# Patient Record
Sex: Female | Born: 1960 | Hispanic: Yes | Smoking: Former smoker
Health system: Southern US, Community
[De-identification: ages and names within clinical notes are randomized; demographics above are authoritative.]

## PROBLEM LIST (undated history)

## (undated) DIAGNOSIS — Z9884 Bariatric surgery status: Secondary | ICD-10-CM

## (undated) DIAGNOSIS — M5137 Other intervertebral disc degeneration, lumbosacral region: Secondary | ICD-10-CM

## (undated) DIAGNOSIS — R7303 Prediabetes: Secondary | ICD-10-CM

## (undated) DIAGNOSIS — E785 Hyperlipidemia, unspecified: Secondary | ICD-10-CM

## (undated) DIAGNOSIS — Z9889 Other specified postprocedural states: Secondary | ICD-10-CM

## (undated) DIAGNOSIS — G35 Multiple sclerosis: Secondary | ICD-10-CM

## (undated) DIAGNOSIS — F419 Anxiety disorder, unspecified: Secondary | ICD-10-CM

## (undated) DIAGNOSIS — M79671 Pain in right foot: Secondary | ICD-10-CM

## (undated) DIAGNOSIS — G709 Myoneural disorder, unspecified: Secondary | ICD-10-CM

## (undated) DIAGNOSIS — E559 Vitamin D deficiency, unspecified: Secondary | ICD-10-CM

## (undated) DIAGNOSIS — D649 Anemia, unspecified: Secondary | ICD-10-CM

## (undated) DIAGNOSIS — R42 Dizziness and giddiness: Secondary | ICD-10-CM

## (undated) DIAGNOSIS — J45909 Unspecified asthma, uncomplicated: Secondary | ICD-10-CM

## (undated) DIAGNOSIS — F32A Depression, unspecified: Secondary | ICD-10-CM

## (undated) HISTORY — PX: CHOLECYSTECTOMY: SHX55

## (undated) HISTORY — PX: TONSILLECTOMY: SUR1361

## (undated) HISTORY — PX: GASTRIC BYPASS: SHX52

## (undated) HISTORY — PX: INCISE AND DRAIN ABCESS: PRO64

## (undated) HISTORY — PX: BREAST SURGERY: SHX581

## (undated) HISTORY — PX: BREAST EXCISIONAL BIOPSY: SUR124

## (undated) HISTORY — PX: ABDOMINAL HYSTERECTOMY: SHX81

## (undated) HISTORY — PX: MYOMECTOMY: SHX85

---

## 2008-07-26 ENCOUNTER — Ambulatory Visit: Payer: Self-pay | Admitting: Family Medicine

## 2008-11-04 ENCOUNTER — Ambulatory Visit: Payer: Self-pay | Admitting: Internal Medicine

## 2009-01-01 ENCOUNTER — Ambulatory Visit: Payer: Self-pay | Admitting: Internal Medicine

## 2009-11-03 ENCOUNTER — Ambulatory Visit: Payer: Self-pay | Admitting: Internal Medicine

## 2010-03-11 ENCOUNTER — Ambulatory Visit: Payer: Self-pay | Admitting: Obstetrics and Gynecology

## 2010-04-16 ENCOUNTER — Ambulatory Visit: Payer: Self-pay | Admitting: Internal Medicine

## 2010-07-20 ENCOUNTER — Ambulatory Visit: Payer: Self-pay | Admitting: Internal Medicine

## 2010-09-13 ENCOUNTER — Ambulatory Visit: Payer: Self-pay | Admitting: Internal Medicine

## 2010-09-23 ENCOUNTER — Ambulatory Visit: Payer: Self-pay | Admitting: Internal Medicine

## 2011-02-12 ENCOUNTER — Ambulatory Visit: Payer: Self-pay | Admitting: Family Medicine

## 2012-02-07 ENCOUNTER — Ambulatory Visit: Payer: Self-pay | Admitting: Medical

## 2012-02-07 LAB — RAPID STREP-A WITH REFLX: Micro Text Report: NEGATIVE

## 2012-02-10 LAB — BETA STREP CULTURE(ARMC)

## 2012-12-31 ENCOUNTER — Ambulatory Visit: Payer: Self-pay | Admitting: Family Medicine

## 2013-08-05 ENCOUNTER — Ambulatory Visit: Payer: Self-pay | Admitting: Physician Assistant

## 2013-08-05 LAB — CBC WITH DIFFERENTIAL/PLATELET
Basophil #: 0 10*3/uL (ref 0.0–0.1)
Basophil %: 0.3 %
Eosinophil #: 0 10*3/uL (ref 0.0–0.7)
Eosinophil %: 0 %
HCT: 43.8 % (ref 35.0–47.0)
HGB: 14.1 g/dL (ref 12.0–16.0)
LYMPHS PCT: 5.1 %
Lymphocyte #: 0.7 10*3/uL — ABNORMAL LOW (ref 1.0–3.6)
MCH: 28.3 pg (ref 26.0–34.0)
MCHC: 32.2 g/dL (ref 32.0–36.0)
MCV: 88 fL (ref 80–100)
MONOS PCT: 3.1 %
Monocyte #: 0.4 x10 3/mm (ref 0.2–0.9)
NEUTROS ABS: 13.3 10*3/uL — AB (ref 1.4–6.5)
Neutrophil %: 91.5 %
Platelet: 179 10*3/uL (ref 150–440)
RBC: 4.99 10*6/uL (ref 3.80–5.20)
RDW: 14.8 % — ABNORMAL HIGH (ref 11.5–14.5)
WBC: 14.5 10*3/uL — AB (ref 3.6–11.0)

## 2013-08-05 LAB — BASIC METABOLIC PANEL
Anion Gap: 10 (ref 7–16)
BUN: 19 mg/dL — AB (ref 7–18)
CALCIUM: 8.9 mg/dL (ref 8.5–10.1)
CHLORIDE: 102 mmol/L (ref 98–107)
CO2: 25 mmol/L (ref 21–32)
Creatinine: 0.8 mg/dL (ref 0.60–1.30)
EGFR (Non-African Amer.): >60
Glucose: 89 mg/dL (ref 65–99)
Osmolality: 276 (ref 275–301)
POTASSIUM: 3.7 mmol/L (ref 3.5–5.1)
Sodium: 137 mmol/L (ref 136–145)

## 2014-07-12 ENCOUNTER — Ambulatory Visit: Payer: Self-pay

## 2014-07-25 ENCOUNTER — Ambulatory Visit: Payer: Self-pay

## 2014-10-28 ENCOUNTER — Ambulatory Visit: Payer: Self-pay | Admitting: Family Medicine

## 2015-11-05 ENCOUNTER — Ambulatory Visit: Payer: Self-pay | Attending: Oncology

## 2015-11-28 ENCOUNTER — Ambulatory Visit
Admission: RE | Admit: 2015-11-28 | Discharge: 2015-11-28 | Disposition: A | Discharge status: Home / Self Care | Payer: Self-pay | Source: Ambulatory Visit | Attending: Oncology | Admitting: Oncology

## 2015-11-28 ENCOUNTER — Ambulatory Visit: Payer: Self-pay | Attending: Oncology

## 2015-11-28 VITALS — BP 138/78 | HR 78 | Temp 98.3°F | Resp 20 | Ht 66.54 in | Wt 190.8 lb

## 2015-11-28 DIAGNOSIS — Z0000 Encounter for general adult medical examination without abnormal findings: Secondary | ICD-10-CM

## 2015-11-28 NOTE — Progress Notes (Addendum)
Subjective:     Patient ID: Nancy Duran, female   DOB: 10/04/1960, 55 y.o.   MRN: [id number]  HPI   Review of Systems     Objective:   Physical Exam  Pulmonary/Chest:         Assessment:     55 year old patient presents for [facility] clinic visit. Patient screened, and meets BCCCP eligibility.  Patient does not have insurance, Medicare or Medicaid.  Handout given on Affordable Care Act.  Instructed patient on breast self-exam using teach back method.  CBE unremarkable.  Noted superficial sebaceous cyst upper inner quadrant right breast noted on prior exams.  Patient has MS, and is being followed by physician in [place].  States she will receive Medicare in May to help cover medications.    Plan:     Sent for bilateral screening mammogram.

## 2015-12-03 NOTE — Progress Notes (Signed)
Letter mailed from [facility] to notify of normal mammogram results.  Patient to return in one year for annual screening.  Copy to [facility]. 

## 2015-12-05 ENCOUNTER — Encounter: Payer: Self-pay | Admitting: *Deleted

## 2015-12-05 ENCOUNTER — Ambulatory Visit
Admission: EM | Admit: 2015-12-05 | Discharge: 2015-12-05 | Disposition: A | Discharge status: Home / Self Care | Payer: Self-pay | Attending: Family Medicine | Admitting: Family Medicine

## 2015-12-05 DIAGNOSIS — J069 Acute upper respiratory infection, unspecified: Secondary | ICD-10-CM

## 2015-12-05 DIAGNOSIS — J019 Acute sinusitis, unspecified: Secondary | ICD-10-CM

## 2015-12-05 LAB — RAPID INFLUENZA A&B ANTIGENS: Influenza B (ARMC): NEGATIVE

## 2015-12-05 LAB — RAPID INFLUENZA A&B ANTIGENS (ARMC ONLY): INFLUENZA A (ARMC): NEGATIVE

## 2015-12-05 LAB — RAPID STREP SCREEN (MED CTR MEBANE ONLY): STREPTOCOCCUS, GROUP A SCREEN (DIRECT): NEGATIVE

## 2015-12-05 MED ORDER — FEXOFENADINE-PSEUDOEPHED ER 180-240 MG PO TB24: 1.0000 | ORAL_TABLET | Freq: Every day | ORAL | Status: DC

## 2015-12-05 MED ORDER — FLUTICASONE PROPIONATE 50 MCG/ACT NA SUSP: 2.0000 | Freq: Every day | NASAL | Status: AC

## 2015-12-05 MED ORDER — BENZONATATE 200 MG PO CAPS: 200.0000 mg | ORAL_CAPSULE | Freq: Three times a day (TID) | ORAL | Status: DC | PRN

## 2015-12-05 MED ORDER — AMOXICILLIN-POT CLAVULANATE 875-125 MG PO TABS: 1.0000 | ORAL_TABLET | Freq: Two times a day (BID) | ORAL | Status: DC

## 2015-12-05 NOTE — Discharge Instructions (Signed)
Sinus Rinse WHAT IS A SINUS RINSE? A sinus rinse is a home treatment. It rinses your sinuses with a mixture of salt and water (saline solution). Sinuses are air-filled spaces in your skull behind the bones of your face and forehead. They open into your nasal cavity. To do a sinus rinse, you will need:  Saline solution.  Neti pot or spray bottle. This releases the saline solution into your nose and through your sinuses. You can buy neti pots and spray bottles at:  Your local pharmacy.  A health food store.  Online. WHEN WOULD I DO A SINUS RINSE?  A sinus rinse can help to clear your nasal cavity. It can clear:   Mucus.  Dirt.  Dust.  Pollen. You may do a sinus rinse when you have:  A cold.  A virus.  Allergies.  A sinus infection.  A stuffy nose. If you are considering a sinus rinse:  Ask your child's doctor before doing a sinus rinse on your child.  Do not do a sinus rinse if you have had:  Ear or nasal surgery.  An ear infection.  Blocked ears. HOW DO I DO A SINUS RINSE?   Wash your hands.  Disinfect your device using the directions that came with the device.  Dry your device.  Use the solution that comes with your device or one that is sold separately in stores. Follow the mixing directions on the package.  Fill your device with the amount of saline solution as stated in the device instructions.  Stand over a sink and tilt your head sideways over the sink.  Place the spout of the device in your upper nostril (the one closer to the ceiling).  Gently pour or squeeze the saline solution into the nasal cavity. The liquid should drain to the lower nostril if you are not too congested.  Gently blow your nose. Blowing too hard may cause ear pain.  Repeat in the other nostril.  Clean and rinse your device with clean water.  Air-dry your device. ARE THERE RISKS OF A SINUS RINSE?  Sinus rinse is normally very safe and helpful. However, there are a few  risks, which include:   A burning feeling in the sinuses. This may happen if you do not make the saline solution as instructed. Make sure to follow all directions when making the saline solution.  Infection from unclean water. This is rare, but possible.  Nasal irritation.   This information is not intended to replace advice given to you by your health care provider. Make sure you discuss any questions you have with your health care provider.   Document Released: 02/15/2014 Document Reviewed: 02/15/2014 Elsevier Interactive Patient Education 2016 Elsevier Inc.  Upper Respiratory Infection, Adult Most upper respiratory infections (URIs) are caused by a virus. A URI affects the nose, throat, and upper air passages. The most common type of URI is often called "the common cold." HOME CARE   Take medicines only as told by your doctor.  Gargle warm saltwater or take cough drops to comfort your throat as told by your doctor.  Use a warm mist humidifier or inhale steam from a shower to increase air moisture. This may make it easier to breathe.  Drink enough fluid to keep your pee (urine) clear or pale yellow.  Eat soups and other clear broths.  Have a healthy diet.  Rest as needed.  Go back to work when your fever is gone or your doctor says it is  okay.  You may need to stay home longer to avoid giving your URI to others.  You can also wear a face mask and wash your hands often to prevent spread of the virus.  Use your inhaler more if you have asthma.  Do not use any tobacco products, including cigarettes, chewing tobacco, or electronic cigarettes. If you need help quitting, ask your doctor. GET HELP IF:  You are getting worse, not better.  Your symptoms are not helped by medicine.  You have chills.  You are getting more short of breath.  You have brown or red mucus.  You have yellow or brown discharge from your nose.  You have pain in your face, especially when you  bend forward.  You have a fever.  You have puffy (swollen) neck glands.  You have pain while swallowing.  You have white areas in the back of your throat. GET HELP RIGHT AWAY IF:   You have very bad or constant:  Headache.  Ear pain.  Pain in your forehead, behind your eyes, and over your cheekbones (sinus pain).  Chest pain.  You have long-lasting (chronic) lung disease and any of the following:  Wheezing.  Long-lasting cough.  Coughing up blood.  A change in your usual mucus.  You have a stiff neck.  You have changes in your:  Vision.  Hearing.  Thinking.  Mood. MAKE SURE YOU:   Understand these instructions.  Will watch your condition.  Will get help right away if you are not doing well or get worse.   This information is not intended to replace advice given to you by your health care provider. Make sure you discuss any questions you have with your health care provider.   Document Released: 01/07/2008 Document Revised: 12/05/2014 Document Reviewed: 10/26/2013 Elsevier Interactive Patient Education [employer].

## 2015-12-05 NOTE — ED Provider Notes (Signed)
CSN: [id number]     Arrival date & time 12/05/15  47 History   First MD Initiated Contact with Patient 12/05/15 1224       Nurses notes were reviewed. Chief Complaint  Patient presents with  . Headache  . Sore Throat  . Fever  . Nasal Congestion  Patient reports that 2 weeks she's not felt herself or for right. She's told me that several times during this visit that she doesn't feel right. She reports days congestion cough and pressure behind her eyes. Sore throat as well. She denies coughing up anything productive but also has had a cough and congestion. When asked about fever that her description of her fever is vague temp spikes that last one was about 2 or 3 days ago. She states she's tried several over-the-counter medications and treatments without success.   She is not a smoker at this time no significant family medical history she has had breast biopsy and she has MS. No known drug allergies.   (Consider location/radiation/quality/duration/timing/severity/associated sxs/prior Treatment) Patient is a 55 y.o. female presenting with headaches, pharyngitis, and fever. The history is provided by the patient. No language interpreter was used.  Headache Pain location:  Generalized Radiates to:  Does not radiate Timing:  Constant Associated symptoms: fever   Sore Throat This is a new problem. The current episode started more than 1 week ago. The problem occurs constantly. The problem has not changed since onset.Associated symptoms include headaches. Nothing aggravates the symptoms. Nothing relieves the symptoms. She has tried nothing for the symptoms. The treatment provided no relief.  Fever Temp source:  Subjective Timing:  Intermittent Progression:  Waxing and waning Associated symptoms: headaches     Past Medical History  Diagnosis Date  . Multiple sclerosis [facility])    Past Surgical History  Procedure Laterality Date  . Breast excisional biopsy Left 1990's    NEG   Family  History  Problem Relation Age of Onset  . Breast cancer Sister 6  . Breast cancer Paternal Aunt    Social History  Substance Use Topics  . Smoking status: Never Smoker   . Smokeless tobacco: None  . Alcohol Use: No   OB History    No data available     Review of Systems  Constitutional: Positive for fever.  Neurological: Positive for headaches.  All other systems reviewed and are negative.   Allergies  Review of patient's allergies indicates no known allergies.  Home Medications   Prior to Admission medications   Medication Sig Start Date End Date Taking? Authorizing Provider  cholecalciferol (VITAMIN D) 1000 units tablet Take 2,000 Units by mouth daily.   Yes Historical Provider, MD  Glatiramer Acetate (COPAXONE) 40 MG/ML SOSY Inject into the skin.   Yes Historical Provider, MD  magnesium 30 MG tablet Take 30 mg by mouth 2 (two) times daily.   Yes Historical Provider, MD  vitamin B-12 (CYANOCOBALAMIN) 1000 MCG tablet Take 1,000 mcg by mouth daily.   Yes Historical Provider, MD  amoxicillin-clavulanate (AUGMENTIN) 875-125 MG tablet Take 1 tablet by mouth 2 (two) times daily. 12/05/15   Frederich Cha, MD  benzonatate (TESSALON) 200 MG capsule Take 1 capsule (200 mg total) by mouth 3 (three) times daily as needed for cough. 12/05/15   Frederich Cha, MD  fexofenadine-pseudoephedrine (ALLEGRA-D ALLERGY & CONGESTION) 180-240 MG 24 hr tablet Take 1 tablet by mouth daily. 12/05/15   Frederich Cha, MD  fluticasone (FLONASE) 50 MCG/ACT nasal spray Place 2 sprays into both  nostrils daily. 12/05/15   Frederich Cha, MD   Meds Ordered and Administered this Visit  Medications - No data to display  BP 119/79 mmHg  Pulse 68  Temp(Src) 98.5 F (36.9 C) (Oral)  Resp 16  Ht 5\' 6"  (1.676 m)  Wt 190 lb (86.183 kg)  BMI 30.68 kg/m2  SpO2 99% No data found.   Physical Exam  Constitutional: She appears well-developed and well-nourished.  HENT:  Head: Normocephalic and atraumatic.  Right Ear:  Hearing, tympanic membrane, external ear and ear canal normal.  Left Ear: Hearing, tympanic membrane, external ear and ear canal normal.  Nose: Rhinorrhea present. Right sinus exhibits maxillary sinus tenderness. Left sinus exhibits maxillary sinus tenderness.  Mouth/Throat: Mucous membranes are normal. Uvula swelling present. Posterior oropharyngeal erythema present.  Vitals reviewed.   ED Course  Procedures (including critical care time)  Labs Review Labs Reviewed  RAPID INFLUENZA A&B ANTIGENS ([facility] ONLY)  RAPID STREP SCREEN (NOT AT [facility])  CULTURE, GROUP A STREP [facility])    Imaging Review No results found.   Visual Acuity Review  Right Eye Distance:   Left Eye Distance:   Bilateral Distance:    Right Eye Near:   Left Eye Near:    Bilateral Near:         MDM   1. Acute sinusitis, recurrence not specified, unspecified location   2. URI, acute    Note: This dictation was prepared with Dragon dictation along with smaller phrase technology. Any transcriptional errors that result from this process are unintentional.    Frederich Cha, MD 12/05/15 1327

## 2015-12-05 NOTE — ED Notes (Signed)
Headache, sore throat, intermittent fever, post nasal drainage, and head congestion x2 weeks. Pt has hx of MS.

## 2015-12-07 LAB — CULTURE, GROUP A STREP (THRC)

## 2016-02-27 ENCOUNTER — Encounter: Payer: Self-pay | Admitting: Emergency Medicine

## 2016-02-27 ENCOUNTER — Ambulatory Visit
Admission: EM | Admit: 2016-02-27 | Discharge: 2016-02-27 | Disposition: A | Discharge status: Home / Self Care | Payer: Medicare Other | Attending: Family Medicine | Admitting: Family Medicine

## 2016-02-27 DIAGNOSIS — M2550 Pain in unspecified joint: Secondary | ICD-10-CM

## 2016-02-27 NOTE — ED Provider Notes (Signed)
[facility]    CSN: [id number] Arrival date & time: 02/27/16  1207  First Provider Contact:  First MD Initiated Contact with Patient 02/27/16 1243        History   Chief Complaint Chief Complaint  Patient presents with  . Generalized Body Aches    HPI Nancy Duran is a 55 y.o. female.   HPI: Patient presents today with generalized joint pain. She states that she has been feeling overall "bad"for the last few months. She believes this started back in March 2017. She does not relate this to her MS. After some questioning she states that she started to feel this way after she came back from [place]. She denies any history of diabetes or hypertension. She denies any history of lupus or arthritis that she knows of. She denies any history of any tick bites or history of Lyme's disease. She does state that she has had her vitamin D checked in the past and was told that it was low. She does take vitamin D3 supplements daily. She denies any history of thyroid disease. She denies any history of smoking or alcohol use. She denies any significant weight loss or gain, chest pain, shortness of breath, headaches.  Past Medical History:  Diagnosis Date  . Multiple sclerosis ([place])     There are no active problems to display for this patient.   Past Surgical History:  Procedure Laterality Date  . ABDOMINAL HYSTERECTOMY    . BREAST EXCISIONAL BIOPSY Left 1990's   NEG  . CESAREAN SECTION    . GASTRIC BYPASS    . TONSILLECTOMY      OB History    No data available       Home Medications    Prior to Admission medications   Medication Sig Start Date End Date Taking? Authorizing Provider  cholecalciferol (VITAMIN D) 1000 units tablet Take 2,000 Units by mouth daily.    Historical Provider, MD  fluticasone (FLONASE) 50 MCG/ACT nasal spray Place 2 sprays into both nostrils daily. 12/05/15   Frederich Cha, MD  Glatiramer Acetate (COPAXONE) 40 MG/ML SOSY Inject  into the skin.    Historical Provider, MD  magnesium 30 MG tablet Take 30 mg by mouth 2 (two) times daily.    Historical Provider, MD  vitamin B-12 (CYANOCOBALAMIN) 1000 MCG tablet Take 1,000 mcg by mouth daily.    Historical Provider, MD    Family History Family History  Problem Relation Age of Onset  . Breast cancer Sister 63  . Breast cancer Paternal Aunt     Social History Social History  Substance Use Topics  . Smoking status: Never Smoker  . Smokeless tobacco: Never Used  . Alcohol use No     Allergies   Review of patient's allergies indicates no known allergies.   Review of Systems Review of Systems: Negative except mentioned above.   Physical Exam Triage Vital Signs ED Triage Vitals  Enc Vitals Group     BP 02/27/16 1224 121/71     Pulse Rate 02/27/16 1224 69     Resp 02/27/16 1224 16     Temp 02/27/16 1224 98.2 F (36.8 C)     Temp Source 02/27/16 1224 Tympanic     SpO2 02/27/16 1224 100 %     Weight 02/27/16 1224 180 lb (81.6 kg)     Height 02/27/16 1224 5\' 6"  (1.676 m)     Head Circumference --      Peak Flow --  Pain Score 02/27/16 1227 9     Pain Loc --      Pain Edu? --      Excl. in [place]? --    No data found.   Updated Vital Signs BP 121/71 (BP Location: Left Arm)   Pulse 69   Temp 98.2 F (36.8 C) (Tympanic)   Resp 16   Ht 5\' 6"  (1.676 m)   Wt 180 lb (81.6 kg)   SpO2 100%   BMI 29.05 kg/m       Physical Exam:  GENERAL: NAD HEENT: no pharyngeal erythema, no exudate, no erythema of TMs, no cervical LAD RESP: CTA B CARD: RRR EXTREM: No obvious swelling of joints, no warmth or erythema of joints, full range of motion NEURO: CN II-XII grossly intact    UC Treatments / Results  Labs (all labs ordered are listed, but only abnormal results are displayed) Labs Reviewed - No data to display  EKG  EKG Interpretation None       Radiology No results found.  Procedures Procedures (including critical care  time)  Medications Ordered in UC Medications - No data to display   Initial Impression / Assessment and Plan / UC Course  I have reviewed the triage vital signs and the nursing notes.  Pertinent labs & imaging results that were available during my care of the patient were reviewed by me and considered in my medical decision making (see chart for details).  Clinical Course   A/P: Generalized joint pain- recommend that patient establish care with a primary care physician, would recommend basic lab work including some rheumatologic lab work. May also want to consider doing Lyme titers, TSH. Advised patient to take Tylenol and/or Ibuprofen to see if these medications help her symptoms. If anything worsens she will seek immediate medical attention.  Final Clinical Impressions(s) / UC Diagnoses   Final diagnoses:  None    New Prescriptions New Prescriptions   No medications on file     Paulina Fusi, MD 02/27/16 1306

## 2016-02-27 NOTE — ED Triage Notes (Signed)
Patient c/o pain all over her body that started getting worse over the past 2 months.  Patient reports that she is always in pain.

## 2016-03-03 DIAGNOSIS — Z862 Personal history of diseases of the blood and blood-forming organs and certain disorders involving the immune mechanism: Secondary | ICD-10-CM | POA: Diagnosis not present

## 2016-03-03 DIAGNOSIS — G35 Multiple sclerosis: Secondary | ICD-10-CM | POA: Diagnosis not present

## 2016-03-03 DIAGNOSIS — Z79899 Other long term (current) drug therapy: Secondary | ICD-10-CM | POA: Diagnosis not present

## 2016-03-03 DIAGNOSIS — M791 Myalgia: Secondary | ICD-10-CM | POA: Diagnosis not present

## 2016-05-29 DIAGNOSIS — Z23 Encounter for immunization: Secondary | ICD-10-CM | POA: Diagnosis not present

## 2016-10-06 ENCOUNTER — Ambulatory Visit
Admission: EM | Admit: 2016-10-06 | Discharge: 2016-10-06 | Disposition: A | Discharge status: Home / Self Care | Payer: Medicare Other | Attending: Family Medicine | Admitting: Family Medicine

## 2016-10-06 DIAGNOSIS — J0100 Acute maxillary sinusitis, unspecified: Secondary | ICD-10-CM

## 2016-10-06 DIAGNOSIS — J0110 Acute frontal sinusitis, unspecified: Secondary | ICD-10-CM | POA: Diagnosis not present

## 2016-10-06 MED ORDER — AMOXICILLIN-POT CLAVULANATE 875-125 MG PO TABS: 1.0000 | ORAL_TABLET | Freq: Two times a day (BID) | ORAL | 0 refills | Status: DC

## 2016-10-06 NOTE — ED Triage Notes (Signed)
Patient complains of sinus pain and pressure, headaches, sore throat, dry mouth, fatigue, cough-slightly, runny nose x 3 weeks.

## 2016-10-06 NOTE — Discharge Instructions (Signed)
Take medication as prescribed. Rest. Drink plenty of fluids.  ° °Follow up with your primary care physician this week as needed. Return to Urgent care for new or worsening concerns.  ° °

## 2016-10-06 NOTE — ED Provider Notes (Signed)
[facility] ____________________________________________  Time seen: Approximately 3:07 PM  I have reviewed the triage vital signs and the nursing notes.   HISTORY  Chief Complaint Sinusitis   HPI Nancy Duran is a 56 y.o. female  presenting for the complaints of 3 weeks of runny nose, nasal congestion and sinus pressure. Patient reports initially she was able to blow her nose and get some drainage out, but reports now it is more of a clogged sensation with postnasal drainage. Patient reports she feels congestion in her ears as well as her upper mouth and teeth. Reports intermittent cough and tickle in her throat. States cough is occasionally productive of greenish mucus. States she can feel the drainage in the back or throat at night. Patient reports that symptoms have been unresolved with over-the-counter Alka-Seltzer combination agents. Denies known fevers. Reports continues to eat and drink overall well, but reports somewhat of a decreased appetite. Denies urinary changes. Denies known sick contacts. Reports is continue to remain active.   Denies chest pain, shortness of breath, abdominal pain, dysuria, extremity pain, extremity swelling or rash. Denies recent sickness. Denies recent antibiotic use. Denies cardiac history. Denies renal insufficiency.  No LMP recorded. Patient has had a hysterectomy.  Lenox Ahr, MD: PCP   Past Medical History:  Diagnosis Date  . Multiple sclerosis ([place])     There are no active problems to display for this patient.   Past Surgical History:  Procedure Laterality Date  . ABDOMINAL HYSTERECTOMY    . BREAST EXCISIONAL BIOPSY Left 1990's   NEG  . CESAREAN SECTION    . GASTRIC BYPASS    . TONSILLECTOMY       No current facility-administered medications for this encounter.   Current Outpatient Prescriptions:  .  cholecalciferol (VITAMIN D) 1000 units tablet, Take 2,000 Units by mouth daily., Disp: , Rfl:    .  Glatiramer Acetate (COPAXONE) 40 MG/ML SOSY, Inject into the skin., Disp: , Rfl:  .  vitamin B-12 (CYANOCOBALAMIN) 1000 MCG tablet, Take 1,000 mcg by mouth daily., Disp: , Rfl:  .  amoxicillin-clavulanate (AUGMENTIN) 875-125 MG tablet, Take 1 tablet by mouth every 12 (twelve) hours., Disp: 20 tablet, Rfl: 0 .  fluticasone (FLONASE) 50 MCG/ACT nasal spray, Place 2 sprays into both nostrils daily., Disp: 16 g, Rfl: 0 .  magnesium 30 MG tablet, Take 30 mg by mouth 2 (two) times daily., Disp: , Rfl:   Allergies Patient has no known allergies.  Family History  Problem Relation Age of Onset  . Breast cancer Sister 47  . Breast cancer Paternal Aunt     Social History Social History  Substance Use Topics  . Smoking status: Never Smoker  . Smokeless tobacco: Never Used  . Alcohol use No    Review of Systems Constitutional: No fever/chills Eyes: No visual changes. ENT: States mild scratchy sore throat. Cardiovascular: Denies chest pain. Respiratory: Denies shortness of breath. Gastrointestinal: No abdominal pain.  No nausea, no vomiting.  No diarrhea.  No constipation. Genitourinary: Negative for dysuria. Musculoskeletal: Negative for back pain. Skin: Negative for rash. Neurological: Negative for headaches, focal weakness or numbness.  10-point ROS otherwise negative.  ____________________________________________   PHYSICAL EXAM:  VITAL SIGNS: ED Triage Vitals  Enc Vitals Group     BP 10/06/16 1330 131/73     Pulse Rate 10/06/16 1330 65     Resp 10/06/16 1330 17     Temp 10/06/16 1330 98.6 F (37 C)  Temp Source 10/06/16 1330 Oral     SpO2 10/06/16 1330 100 %     Weight 10/06/16 1329 191 lb (86.6 kg)     Height 10/06/16 1329 5\' 6"  (1.676 m)     Head Circumference --      Peak Flow --      Pain Score 10/06/16 1330 6     Pain Loc --      Pain Edu? --      Excl. in [place]? --     Constitutional: Alert and oriented. Well appearing and in no acute distress. Eyes:  Conjunctivae are normal. PERRL. EOMI. Head: Atraumatic.Mild to moderate tenderness to palpation bilateral frontal and maxillary sinuses. No swelling. No erythema.   Ears: no erythema, normal TMs bilaterally.   Nose: nasal congestion with bilateral nasal turbinate erythema and edema.   Mouth/Throat: Mucous membranes are moist.  Oropharynx non-erythematous.No tonsillar swelling or exudate.  Neck: No stridor.  No cervical spine tenderness to palpation. Hematological/Lymphatic/Immunilogical: No cervical lymphadenopathy. Cardiovascular: Normal rate, regular rhythm. Grossly normal heart sounds.  Good peripheral circulation. Respiratory: Normal respiratory effort.  No retractions. No wheezes, rales or rhonchi. Good air movement.  Gastrointestinal: Soft and nontender. No CVA tenderness. Musculoskeletal:  Bilateral pedal pulses equal and easily palpated. No cervical, thoracic or lumbar tenderness to palpation.  Neurologic:  Normal speech and language. No gait instability. Skin:  Skin is warm, dry  Psychiatric: Mood and affect are normal. Speech and behavior are normal.  ___________________________________________   LABS (all labs ordered are listed, but only abnormal results are displayed)  Labs Reviewed - No data to display ____________________________________________  PROCEDURES Procedures    INITIAL IMPRESSION / ASSESSMENT AND PLAN / ED COURSE  Pertinent labs & imaging results that were available during my care of the patient were reviewed by me and considered in my medical decision making (see chart for details).  Well-appearing patient. No acute distress. Suspect sinusitis. Will treat patient with oral Augmentin. Encouraged supportive care, rest, fluids, nasal saline rinses and continue daily antihistamine.Discussed indication, risks and benefits of medications with patient.  Discussed follow up with Primary care physician this week. Discussed follow up and return parameters including  no resolution or any worsening concerns. Patient verbalized understanding and agreed to plan.   ____________________________________________   FINAL CLINICAL IMPRESSION(S) / ED DIAGNOSES  Final diagnoses:  Acute frontal sinusitis, recurrence not specified  Acute maxillary sinusitis, recurrence not specified     Discharge Medication List as of 10/06/2016  2:10 PM    START taking these medications   Details  amoxicillin-clavulanate (AUGMENTIN) 875-125 MG tablet Take 1 tablet by mouth every 12 (twelve) hours., Starting Mon 10/06/2016, Normal        Note: This dictation was prepared with Dragon dictation along with smaller phrase technology. Any transcriptional errors that result from this process are unintentional.         Marylene Land, NP 10/06/16 [place], NP 10/06/16 1517

## 2016-10-09 ENCOUNTER — Telehealth: Payer: Self-pay

## 2016-10-09 NOTE — Telephone Encounter (Signed)
Courtesy call back completed today for patient's recent visit at [facility]. Patient did not answer, left message on machine to call back with any questions or concerns.   

## 2016-11-05 DIAGNOSIS — G35 Multiple sclerosis: Secondary | ICD-10-CM | POA: Diagnosis not present

## 2016-11-05 DIAGNOSIS — Z5181 Encounter for therapeutic drug level monitoring: Secondary | ICD-10-CM | POA: Diagnosis not present

## 2016-11-05 DIAGNOSIS — E559 Vitamin D deficiency, unspecified: Secondary | ICD-10-CM | POA: Diagnosis not present

## 2016-11-13 DIAGNOSIS — G35 Multiple sclerosis: Secondary | ICD-10-CM | POA: Diagnosis not present

## 2017-03-11 DIAGNOSIS — G35 Multiple sclerosis: Secondary | ICD-10-CM | POA: Diagnosis not present

## 2017-06-01 DIAGNOSIS — M461 Sacroiliitis, not elsewhere classified: Secondary | ICD-10-CM | POA: Diagnosis not present

## 2017-06-01 DIAGNOSIS — M50322 Other cervical disc degeneration at C5-C6 level: Secondary | ICD-10-CM | POA: Diagnosis not present

## 2017-06-01 DIAGNOSIS — M5124 Other intervertebral disc displacement, thoracic region: Secondary | ICD-10-CM | POA: Diagnosis not present

## 2017-06-01 DIAGNOSIS — G894 Chronic pain syndrome: Secondary | ICD-10-CM | POA: Diagnosis not present

## 2017-06-01 DIAGNOSIS — M47812 Spondylosis without myelopathy or radiculopathy, cervical region: Secondary | ICD-10-CM | POA: Diagnosis not present

## 2017-06-01 DIAGNOSIS — M47816 Spondylosis without myelopathy or radiculopathy, lumbar region: Secondary | ICD-10-CM | POA: Diagnosis not present

## 2017-06-01 DIAGNOSIS — M25512 Pain in left shoulder: Secondary | ICD-10-CM | POA: Diagnosis not present

## 2017-06-01 DIAGNOSIS — M7062 Trochanteric bursitis, left hip: Secondary | ICD-10-CM | POA: Diagnosis not present

## 2017-06-01 DIAGNOSIS — M5481 Occipital neuralgia: Secondary | ICD-10-CM | POA: Diagnosis not present

## 2017-06-01 DIAGNOSIS — M25511 Pain in right shoulder: Secondary | ICD-10-CM | POA: Diagnosis not present

## 2017-06-01 DIAGNOSIS — M5136 Other intervertebral disc degeneration, lumbar region: Secondary | ICD-10-CM | POA: Diagnosis not present

## 2017-06-01 DIAGNOSIS — M62838 Other muscle spasm: Secondary | ICD-10-CM | POA: Diagnosis not present

## 2017-06-09 ENCOUNTER — Encounter: Payer: Self-pay | Admitting: Emergency Medicine

## 2017-06-09 ENCOUNTER — Ambulatory Visit
Admission: EM | Admit: 2017-06-09 | Discharge: 2017-06-09 | Disposition: A | Discharge status: Home / Self Care | Payer: Medicare Other | Attending: Family Medicine | Admitting: Family Medicine

## 2017-06-09 DIAGNOSIS — B349 Viral infection, unspecified: Secondary | ICD-10-CM

## 2017-06-09 DIAGNOSIS — R51 Headache: Secondary | ICD-10-CM

## 2017-06-09 DIAGNOSIS — J029 Acute pharyngitis, unspecified: Secondary | ICD-10-CM | POA: Diagnosis not present

## 2017-06-09 NOTE — ED Provider Notes (Signed)
[facility]    CSN: [id number] Arrival date & time: 06/09/17  6294 History   Chief Complaint Chief Complaint  Patient presents with  . Cough  . Sore Throat  . Generalized Body Aches   HPI 56 year old female with multiple sclerosis presents with the above complaints.  Patient states that she has not felt well since Sunday.  Started with some chest soreness and now has developed sore throat and headaches as well as body aches.  She has had no fever.  No shortness of breath.  No medications or interventions tried.  No known exacerbating or relieving factors.  Her primary concern is getting sick in the setting of her MS.  She does not want things to get worse.  She was encouraged to see a physician by her physician who is performing her steroid injections tomorrow.  She is concerned that she may not be up for this.  She has no other complaints or concerns at this time.   Past Medical History:  Diagnosis Date  . Multiple sclerosis ([place])    Past Surgical History:  Procedure Laterality Date  . ABDOMINAL HYSTERECTOMY    . BREAST EXCISIONAL BIOPSY Left 1990's   NEG  . CESAREAN SECTION    . GASTRIC BYPASS    . TONSILLECTOMY     OB History    No data available     Home Medications    Prior to Admission medications   Medication Sig Start Date End Date Taking? Authorizing Provider  cholecalciferol (VITAMIN D) 1000 units tablet Take 4,000 Units daily by mouth.     [provider]  fluticasone (FLONASE) 50 MCG/ACT nasal spray Place 2 sprays into both nostrils daily. 12/05/15   Frederich Cha, MD  Glatiramer Acetate (COPAXONE) 40 MG/ML SOSY Inject into the skin.    [provider]  magnesium 30 MG tablet Take 30 mg by mouth 2 (two) times daily.    [provider]  vitamin B-12 (CYANOCOBALAMIN) 1000 MCG tablet Take 1,000 mcg by mouth daily.    [provider]    Family History Family History  Problem Relation Age of Onset  . Breast cancer  Sister 3  . Breast cancer Paternal Aunt     Social History Social History   Tobacco Use  . Smoking status: Never Smoker  . Smokeless tobacco: Never Used  Substance Use Topics  . Alcohol use: No  . Drug use: No     Allergies   Patient has no known allergies.   Review of Systems Review of Systems  Constitutional: Negative for fever.  HENT: Positive for sore throat.   Respiratory: Positive for chest tightness. Negative for shortness of breath.   Neurological: Positive for headaches.   Physical Exam Triage Vital Signs ED Triage Vitals  Enc Vitals Group     BP 06/09/17 0845 120/67     Pulse Rate 06/09/17 0845 63     Resp 06/09/17 0845 16     Temp 06/09/17 0845 98.5 F (36.9 C)     Temp Source 06/09/17 0845 Oral     SpO2 06/09/17 0845 100 %     Weight 06/09/17 0842 203 lb 0.7 oz (92.1 kg)     Height 06/09/17 0842 5\' 6"  (1.676 m)     Head Circumference --      Peak Flow --      Pain Score 06/09/17 0842 0     Pain Loc --      Pain Edu? --  Excl. in [place]? --    Updated Vital Signs BP 120/67 (BP Location: Left Arm)   Pulse 63   Temp 98.5 F (36.9 C) (Oral)   Resp 16   Ht 5\' 6"  (1.676 m)   Wt 203 lb 0.7 oz (92.1 kg)   SpO2 100%   BMI 32.77 kg/m   Physical Exam  Constitutional: She is oriented to person, place, and time. She appears well-developed. No distress.  HENT:  Head: Normocephalic and atraumatic.  Mild oropharyngeal erythema.  No exudate.  Normal TMs bilaterally.  Eyes: Conjunctivae are normal. Right eye exhibits no discharge. Left eye exhibits no discharge. No scleral icterus.  Neck: Neck supple.  Cardiovascular: Normal rate and regular rhythm.  Soft systolic murmur noted at the right second ICS.  Pulmonary/Chest: Effort normal and breath sounds normal. No respiratory distress. She has no wheezes. She has no rales.  Lymphadenopathy:    She has no cervical adenopathy.  Neurological: She is alert and oriented to person, place, and time.    Psychiatric: She has a normal mood and affect. Her behavior is normal.  Vitals reviewed.  UC Treatments / Results  Labs (all labs ordered are listed, but only abnormal results are displayed) Labs Reviewed - No data to display  EKG  EKG Interpretation None       Radiology No results found.  Procedures Procedures (including critical care time)  Medications Ordered in UC Medications - No data to display   Initial Impression / Assessment and Plan / UC Course  I have reviewed the triage vital signs and the nursing notes.  Pertinent labs & imaging results that were available during my care of the patient were reviewed by me and considered in my medical decision making (see chart for details).     56 year old female who presents with what appears to be a viral illness.  Advised supportive care and over-the-counter Tylenol/Motrin as needed.  Rest, fluids.  I informed her that I felt that she could proceed with her steroid injections as long as she was feeling up to it.  Final Clinical Impressions(s) / UC Diagnoses   Final diagnoses:  Viral illness    ED Discharge Orders    None     Controlled Substance Prescriptions [place] Controlled Substance Registry consulted? Not Applicable   Coral Spikes, [place] 06/09/17 0626

## 2017-06-09 NOTE — Discharge Instructions (Signed)
Your exam was normal  This is viral and we are seeing a lot of this in the community.  Rest, fluids, Tylenol/motrin.  You should be okay to get your injections tomorrow.  Take care  Dr. Lacinda Axon

## 2017-06-09 NOTE — ED Triage Notes (Signed)
Patient c/o bodyaches and fatigue that started on Sunday.  Patient c/o sore throat and HAs that started yesterday.

## 2017-06-10 DIAGNOSIS — M461 Sacroiliitis, not elsewhere classified: Secondary | ICD-10-CM | POA: Diagnosis not present

## 2017-06-19 DIAGNOSIS — M7062 Trochanteric bursitis, left hip: Secondary | ICD-10-CM | POA: Diagnosis not present

## 2017-06-22 ENCOUNTER — Other Ambulatory Visit: Payer: Self-pay

## 2017-06-22 ENCOUNTER — Ambulatory Visit
Admission: EM | Admit: 2017-06-22 | Discharge: 2017-06-22 | Disposition: A | Discharge status: Home / Self Care | Payer: Medicare Other | Attending: Family Medicine | Admitting: Family Medicine

## 2017-06-22 DIAGNOSIS — R059 Cough, unspecified: Secondary | ICD-10-CM

## 2017-06-22 DIAGNOSIS — J0110 Acute frontal sinusitis, unspecified: Secondary | ICD-10-CM

## 2017-06-22 DIAGNOSIS — R05 Cough: Secondary | ICD-10-CM

## 2017-06-22 DIAGNOSIS — J0100 Acute maxillary sinusitis, unspecified: Secondary | ICD-10-CM | POA: Diagnosis not present

## 2017-06-22 MED ORDER — DOXYCYCLINE HYCLATE 100 MG PO CAPS: 100.0000 mg | ORAL_CAPSULE | Freq: Two times a day (BID) | ORAL | 0 refills | Status: DC

## 2017-06-22 MED ORDER — BENZONATATE 200 MG PO CAPS: 200.0000 mg | ORAL_CAPSULE | Freq: Three times a day (TID) | ORAL | 0 refills | Status: DC | PRN

## 2017-06-22 NOTE — Discharge Instructions (Signed)
Take medication as prescribed. Rest. Drink plenty of fluids.  ° °Follow up with your primary care physician this week as needed. Return to Urgent care for new or worsening concerns.  ° °

## 2017-06-22 NOTE — ED Provider Notes (Signed)
[facility] ____________________________________________  Time seen: Approximately 2:29 PM  I have reviewed the triage vital signs and the nursing notes.   HISTORY  Chief Complaint Cough  HPI Nancy Duran is a 56 y.o. female presenting for evaluation of 2 weeks of runny nose, nasal congestion, sinus pressure, sinus drainage and cough.  States throat irritation with postnasal drainage.  States that even her mouth hurts due to the sinus pressure.  States has mild to moderate sinus pressure at this time.  Reports continues to eat and drink well.  Denies known fevers, reports it has some chills last night.  Denies known sick contacts.  Denies other aggravating or alleviating factors.  States has been unresolved with over-the-counter cough and congestion medications.  States that she was seen here urgent care for same complaints 2 weeks ago but reports have not resolved. Denies chest pain, shortness of breath, abdominal pain, or rash. Denies recent sickness. Denies recent antibiotic use.   Lenox Ahr, MD: PCP   Past Medical History:  Diagnosis Date  . Multiple sclerosis ([place])     There are no active problems to display for this patient.   Past Surgical History:  Procedure Laterality Date  . ABDOMINAL HYSTERECTOMY    . BREAST EXCISIONAL BIOPSY Left 1990's   NEG  . CESAREAN SECTION    . GASTRIC BYPASS    . TONSILLECTOMY       No current facility-administered medications for this encounter.   Current Outpatient Medications:  .  cholecalciferol (VITAMIN D) 1000 units tablet, Take 4,000 Units daily by mouth. , Disp: , Rfl:  .  fluticasone (FLONASE) 50 MCG/ACT nasal spray, Place 2 sprays into both nostrils daily., Disp: 16 g, Rfl: 0 .  Glatiramer Acetate (COPAXONE) 40 MG/ML SOSY, Inject into the skin., Disp: , Rfl:  .  magnesium 30 MG tablet, Take 30 mg by mouth 2 (two) times daily., Disp: , Rfl:  .  vitamin B-12 (CYANOCOBALAMIN) 1000 MCG tablet,  Take 1,000 mcg by mouth daily., Disp: , Rfl:  .  benzonatate (TESSALON) 200 MG capsule, Take 1 capsule (200 mg total) 3 (three) times daily as needed by mouth for cough., Disp: 20 capsule, Rfl: 0 .  doxycycline (VIBRAMYCIN) 100 MG capsule, Take 1 capsule (100 mg total) 2 (two) times daily by mouth., Disp: 20 capsule, Rfl: 0  Allergies Patient has no known allergies.  Family History  Problem Relation Age of Onset  . Breast cancer Sister 40  . Breast cancer Paternal Aunt     Social History Social History   Tobacco Use  . Smoking status: Never Smoker  . Smokeless tobacco: Never Used  Substance Use Topics  . Alcohol use: No  . Drug use: No    Review of Systems Constitutional: as above.  Eyes: No visual changes. ENT: as above.  Cardiovascular: Denies chest pain. Respiratory: Denies shortness of breath. Gastrointestinal: No abdominal pain.   Musculoskeletal: Negative for back pain. Skin: Negative for rash.  ____________________________________________   PHYSICAL EXAM:  VITAL SIGNS: ED Triage Vitals  Enc Vitals Group     BP 06/22/17 1332 110/75     Pulse Rate 06/22/17 1332 61     Resp 06/22/17 1332 18     Temp 06/22/17 1332 98.3 F (36.8 C)     Temp Source 06/22/17 1332 Oral     SpO2 06/22/17 1332 100 %     Weight 06/22/17 1330 203 lb (92.1 kg)     Height 06/22/17 1330  5\' 6"  (1.676 m)     Head Circumference --      Peak Flow --      Pain Score 06/22/17 1330 8     Pain Loc --      Pain Edu? --      Excl. in [place]? --     Constitutional: Alert and oriented. Well appearing and in no acute distress. Eyes: Conjunctivae are normal.  Head: Atraumatic.Mild to moderate tenderness to palpation bilateral frontal and maxillary sinuses. No swelling. No erythema.   Ears: no erythema, normal TMs bilaterally.   Nose: nasal congestion with bilateral nasal turbinate erythema and edema.   Mouth/Throat: Mucous membranes are moist.  Oropharynx non-erythematous.No tonsillar swelling  or exudate.  Neck: No stridor.  No cervical spine tenderness to palpation. Hematological/Lymphatic/Immunilogical: No cervical lymphadenopathy. Cardiovascular: Normal rate, regular rhythm. Grossly normal heart sounds.  Good peripheral circulation. Respiratory: Normal respiratory effort.  No retractions. No wheezes, rales or rhonchi. Good air movement.  Gastrointestinal: Soft and nontender.  Musculoskeletal: No cervical, thoracic or lumbar tenderness to palpation.  Neurologic:  Normal speech and language. No gait instability. Skin:  Skin is warm, dry and intact. No rash noted. Psychiatric: Mood and affect are normal. Speech and behavior are normal.  ___________________________________________   LABS (all labs ordered are listed, but only abnormal results are displayed)  Labs Reviewed - No data to display   PROCEDURES Procedures   INITIAL IMPRESSION / ASSESSMENT AND PLAN / ED COURSE  Pertinent labs & imaging results that were available during my care of the patient were reviewed by me and considered in my medical decision making (see chart for details).  Appearing patient.  No acute distress.  We will treat patient with oral doxycycline and as needed Tessalon Perles for sinusitis and cough.  Encourage rest, fluids, supportive care.Discussed indication, risks and benefits of medications with patient.  Discussed follow up with Primary care physician this week. Discussed follow up and return parameters including no resolution or any worsening concerns. Patient verbalized understanding and agreed to plan.   ____________________________________________   FINAL CLINICAL IMPRESSION(S) / ED DIAGNOSES  Final diagnoses:  Acute frontal sinusitis, recurrence not specified  Acute maxillary sinusitis, recurrence not specified  Cough     ED Discharge Orders        Ordered    doxycycline (VIBRAMYCIN) 100 MG capsule  2 times daily     06/22/17 1421    benzonatate (TESSALON) 200 MG capsule   3 times daily PRN     06/22/17 1421       Note: This dictation was prepared with Dragon dictation along with smaller phrase technology. Any transcriptional errors that result from this process are unintentional.         Marylene Land, NP 06/22/17 1725

## 2017-06-22 NOTE — ED Triage Notes (Signed)
Patient complains of cough, sore throat, headache, facial pain and pressure, sensitive mouth. Patient states that she was seen for these symptoms 2 weeks ago and have remained constant.

## 2017-07-16 DIAGNOSIS — M461 Sacroiliitis, not elsewhere classified: Secondary | ICD-10-CM | POA: Diagnosis not present

## 2017-08-11 DIAGNOSIS — M47812 Spondylosis without myelopathy or radiculopathy, cervical region: Secondary | ICD-10-CM | POA: Diagnosis not present

## 2017-09-01 DIAGNOSIS — M5136 Other intervertebral disc degeneration, lumbar region: Secondary | ICD-10-CM | POA: Diagnosis not present

## 2017-09-01 DIAGNOSIS — M47812 Spondylosis without myelopathy or radiculopathy, cervical region: Secondary | ICD-10-CM | POA: Diagnosis not present

## 2017-09-01 DIAGNOSIS — G894 Chronic pain syndrome: Secondary | ICD-10-CM | POA: Diagnosis not present

## 2017-09-01 DIAGNOSIS — M47816 Spondylosis without myelopathy or radiculopathy, lumbar region: Secondary | ICD-10-CM | POA: Diagnosis not present

## 2017-09-08 DIAGNOSIS — M461 Sacroiliitis, not elsewhere classified: Secondary | ICD-10-CM | POA: Diagnosis not present

## 2017-09-11 ENCOUNTER — Other Ambulatory Visit: Payer: Self-pay

## 2017-09-11 ENCOUNTER — Ambulatory Visit
Admission: EM | Admit: 2017-09-11 | Discharge: 2017-09-11 | Disposition: A | Discharge status: Home / Self Care | Payer: Medicare Other | Attending: Family Medicine | Admitting: Family Medicine

## 2017-09-11 ENCOUNTER — Encounter: Payer: Self-pay | Admitting: Emergency Medicine

## 2017-09-11 DIAGNOSIS — R059 Cough, unspecified: Secondary | ICD-10-CM

## 2017-09-11 DIAGNOSIS — J0110 Acute frontal sinusitis, unspecified: Secondary | ICD-10-CM | POA: Diagnosis not present

## 2017-09-11 DIAGNOSIS — R05 Cough: Secondary | ICD-10-CM

## 2017-09-11 MED ORDER — BENZONATATE 200 MG PO CAPS: 200.0000 mg | ORAL_CAPSULE | Freq: Three times a day (TID) | ORAL | 0 refills | Status: DC

## 2017-09-11 MED ORDER — AMOXICILLIN 875 MG PO TABS: 875.0000 mg | ORAL_TABLET | Freq: Two times a day (BID) | ORAL | 0 refills | Status: DC

## 2017-09-11 NOTE — ED Triage Notes (Signed)
Patient c/o cough, congestion, HAs and bodyaches that started on Monday. Patient reports fevers.

## 2017-09-11 NOTE — ED Provider Notes (Signed)
[facility]    CSN: [id number] Arrival date & time: 09/11/17  1336     History   Chief Complaint Chief Complaint  Patient presents with  . Headache  . Cough  . Generalized Body Aches    HPI Nancy Duran is a 57 y.o. female.   The history is provided by the patient.  Headache  Associated symptoms: congestion, cough, facial pain, fatigue, fever and URI   Cough  Associated symptoms: fever and headaches   Associated symptoms: no wheezing   URI  Presenting symptoms: congestion, cough, facial pain, fatigue and fever   Severity:  Moderate Onset quality:  Sudden Duration:  7 days Timing:  Constant Progression:  Worsening Chronicity:  New Relieved by:  Nothing Ineffective treatments:  OTC medications Associated symptoms: headaches and sinus pain   Associated symptoms: no wheezing   Risk factors: immunosuppression and sick contacts   Risk factors: not elderly, no chronic cardiac disease, no chronic kidney disease, no chronic respiratory disease, no diabetes mellitus, no recent illness and no recent travel     Past Medical History:  Diagnosis Date  . Multiple sclerosis ([place])     There are no active problems to display for this patient.   Past Surgical History:  Procedure Laterality Date  . ABDOMINAL HYSTERECTOMY    . BREAST EXCISIONAL BIOPSY Left 1990's   NEG  . CESAREAN SECTION    . GASTRIC BYPASS    . TONSILLECTOMY      OB History    No data available       Home Medications    Prior to Admission medications   Medication Sig Start Date End Date Taking? Authorizing Provider  cholecalciferol (VITAMIN D) 1000 units tablet Take 4,000 Units daily by mouth.    Yes [provider]  Glatiramer Acetate (COPAXONE) 40 MG/ML SOSY Inject into the skin.   Yes [provider]  vitamin B-12 (CYANOCOBALAMIN) 1000 MCG tablet Take 1,000 mcg by mouth daily.   Yes [provider]  amoxicillin (AMOXIL) 875 MG tablet Take  1 tablet (875 mg total) by mouth 2 (two) times daily. 09/11/17   Norval Gable, MD  benzonatate (TESSALON) 200 MG capsule Take 1 capsule (200 mg total) by mouth 3 (three) times daily. 09/11/17   Norval Gable, MD  doxycycline (VIBRAMYCIN) 100 MG capsule Take 1 capsule (100 mg total) 2 (two) times daily by mouth. 06/22/17   Marylene Land, NP  fluticasone [facility]) 50 MCG/ACT nasal spray Place 2 sprays into both nostrils daily. 12/05/15   Frederich Cha, MD  magnesium 30 MG tablet Take 30 mg by mouth 2 (two) times daily.    [provider]    Family History Family History  Problem Relation Age of Onset  . Breast cancer Sister 79  . Breast cancer Paternal Aunt     Social History Social History   Tobacco Use  . Smoking status: Never Smoker  . Smokeless tobacco: Never Used  Substance Use Topics  . Alcohol use: No  . Drug use: No     Allergies   Patient has no known allergies.   Review of Systems Review of Systems  Constitutional: Positive for fatigue and fever.  HENT: Positive for congestion and sinus pain.   Respiratory: Positive for cough. Negative for wheezing.   Neurological: Positive for headaches.     Physical Exam Triage Vital Signs ED Triage Vitals  Enc Vitals Group     BP 09/11/17 1408 104/66  Pulse Rate 09/11/17 1408 66     Resp 09/11/17 1408 16     Temp 09/11/17 1408 99.3 F (37.4 C)     Temp Source 09/11/17 1408 Oral     SpO2 09/11/17 1408 99 %     Weight 09/11/17 1405 200 lb (90.7 kg)     Height 09/11/17 1405 5\' 6"  (1.676 m)     Head Circumference --      Peak Flow --      Pain Score 09/11/17 1405 8     Pain Loc --      Pain Edu? --      Excl. in [place]? --    No data found.  Updated Vital Signs BP 104/66 (BP Location: Left Arm)   Pulse 66   Temp 99.3 F (37.4 C) (Oral)   Resp 16   Ht 5\' 6"  (1.676 m)   Wt 200 lb (90.7 kg)   SpO2 99%   BMI 32.28 kg/m   Visual Acuity Right Eye Distance:   Left Eye Distance:   Bilateral Distance:      Right Eye Near:   Left Eye Near:    Bilateral Near:     Physical Exam  Constitutional: She appears well-developed and well-nourished. No distress.  HENT:  Head: Normocephalic and atraumatic.  Right Ear: Tympanic membrane, external ear and ear canal normal.  Left Ear: Tympanic membrane, external ear and ear canal normal.  Nose: Mucosal edema and rhinorrhea present. No nose lacerations, sinus tenderness, nasal deformity, septal deviation or nasal septal hematoma. No epistaxis.  No foreign bodies. Right sinus exhibits frontal sinus tenderness. Right sinus exhibits no maxillary sinus tenderness. Left sinus exhibits frontal sinus tenderness. Left sinus exhibits no maxillary sinus tenderness.  Mouth/Throat: Uvula is midline, oropharynx is clear and moist and mucous membranes are normal. No oropharyngeal exudate.  Neck: Normal range of motion. Neck supple. No thyromegaly present.  Cardiovascular: Normal rate, regular rhythm and normal heart sounds.  Pulmonary/Chest: Effort normal and breath sounds normal. No stridor. No respiratory distress. She has no wheezes. She has no rales.  Lymphadenopathy:    She has no cervical adenopathy.  Skin: She is not diaphoretic.  Nursing note and vitals reviewed.    UC Treatments / Results  Labs (all labs ordered are listed, but only abnormal results are displayed) Labs Reviewed - No data to display  EKG  EKG Interpretation None       Radiology No results found.  Procedures Procedures (including critical care time)  Medications Ordered in UC Medications - No data to display   Initial Impression / Assessment and Plan / UC Course  I have reviewed the triage vital signs and the nursing notes.  Pertinent labs & imaging results that were available during my care of the patient were reviewed by me and considered in my medical decision making (see chart for details).       Final Clinical Impressions(s) / UC Diagnoses   Final diagnoses:   Acute frontal sinusitis, recurrence not specified  Cough    ED Discharge Orders        Ordered    amoxicillin (AMOXIL) 875 MG tablet  2 times daily     09/11/17 1537    benzonatate (TESSALON) 200 MG capsule  3 times daily     09/11/17 1537     1. diagnosis reviewed with patient 2. rx as per orders above; reviewed possible side effects, interactions, risks and benefits  3. Recommend supportive treatment with rest,  fluids, otc cough/cold meds  4. Follow-up prn if symptoms worsen or don't improve  Controlled Substance Prescriptions [place] Controlled Substance Registry consulted? Not Applicable   Norval Gable, MD 09/11/17 [phone]

## 2017-09-14 ENCOUNTER — Telehealth: Payer: Self-pay

## 2017-09-14 NOTE — Telephone Encounter (Signed)
Called to follow up with patient since visit here at [facility]. Patient instructed to call back with any questions or concerns. MAH  

## 2018-01-21 ENCOUNTER — Other Ambulatory Visit: Payer: Self-pay | Admitting: Family Medicine

## 2018-01-21 DIAGNOSIS — Z1231 Encounter for screening mammogram for malignant neoplasm of breast: Secondary | ICD-10-CM

## 2018-01-26 ENCOUNTER — Ambulatory Visit
Admission: RE | Admit: 2018-01-26 | Discharge: 2018-01-26 | Disposition: A | Discharge status: Home / Self Care | Payer: Medicare HMO | Source: Ambulatory Visit | Attending: Family Medicine | Admitting: Family Medicine

## 2018-01-26 DIAGNOSIS — Z1231 Encounter for screening mammogram for malignant neoplasm of breast: Secondary | ICD-10-CM

## 2018-02-05 DIAGNOSIS — M51379 Other intervertebral disc degeneration, lumbosacral region without mention of lumbar back pain or lower extremity pain: Secondary | ICD-10-CM

## 2018-02-05 DIAGNOSIS — M5137 Other intervertebral disc degeneration, lumbosacral region: Secondary | ICD-10-CM

## 2018-02-05 HISTORY — DX: Other intervertebral disc degeneration, lumbosacral region without mention of lumbar back pain or lower extremity pain: M51.379

## 2018-02-05 HISTORY — DX: Other intervertebral disc degeneration, lumbosacral region: M51.37

## 2018-02-19 ENCOUNTER — Other Ambulatory Visit: Payer: Self-pay | Admitting: Neurology

## 2018-02-19 DIAGNOSIS — G35 Multiple sclerosis: Secondary | ICD-10-CM

## 2018-02-24 ENCOUNTER — Other Ambulatory Visit: Payer: Self-pay | Admitting: Neurology

## 2018-02-24 DIAGNOSIS — G35 Multiple sclerosis: Secondary | ICD-10-CM

## 2018-03-02 ENCOUNTER — Ambulatory Visit: Payer: Medicare HMO

## 2018-03-05 ENCOUNTER — Ambulatory Visit
Admission: RE | Admit: 2018-03-05 | Discharge: 2018-03-05 | Disposition: A | Discharge status: Home / Self Care | Payer: Medicare HMO | Source: Ambulatory Visit | Attending: Neurology | Admitting: Neurology

## 2018-03-05 DIAGNOSIS — G35 Multiple sclerosis: Secondary | ICD-10-CM | POA: Diagnosis present

## 2018-03-05 DIAGNOSIS — M50221 Other cervical disc displacement at C4-C5 level: Secondary | ICD-10-CM | POA: Diagnosis not present

## 2018-03-05 DIAGNOSIS — G8929 Other chronic pain: Secondary | ICD-10-CM | POA: Insufficient documentation

## 2018-03-09 ENCOUNTER — Ambulatory Visit
Admission: RE | Admit: 2018-03-09 | Discharge: 2018-03-09 | Disposition: A | Discharge status: Home / Self Care | Payer: Medicare HMO | Source: Ambulatory Visit | Attending: Neurology | Admitting: Neurology

## 2018-03-09 DIAGNOSIS — G35 Multiple sclerosis: Secondary | ICD-10-CM | POA: Insufficient documentation

## 2018-03-09 LAB — CSF CELL COUNT WITH DIFFERENTIAL
LYMPHS CSF: 52 %
MONOCYTE-MACROPHAGE-SPINAL FLUID: 3 %
RBC Count, CSF: 18571 /mm3 — ABNORMAL HIGH (ref 0–3)
SEGMENTED NEUTROPHILS-CSF: 45 %
Tube #: 3
WBC, CSF: 101 /mm3 (ref 0–5)

## 2018-03-09 LAB — PROTEIN, CSF: Total  Protein, CSF: 98 mg/dL — ABNORMAL HIGH (ref 15–45)

## 2018-03-09 LAB — GLUCOSE, CSF: GLUCOSE CSF: 50 mg/dL (ref 40–70)

## 2018-03-09 LAB — ALBUMIN: ALBUMIN: 3.9 g/dL (ref 3.5–5.0)

## 2018-03-09 MED ORDER — ACETAMINOPHEN 325 MG PO TABS
650.0000 mg | ORAL_TABLET | ORAL | Status: DC | PRN
  Filled 2018-03-09: qty 2

## 2018-03-09 NOTE — OR Nursing (Signed)
Dr Melrose Nakayama office aware of CSF lab results. Ok to discharge post LP

## 2018-03-09 NOTE — Discharge Instructions (Signed)
Lumbar Puncture, Care After °Refer to this sheet in the next few weeks. These instructions provide you with information on caring for yourself after your procedure. Your health care provider may also give you more specific instructions. Your treatment has been planned according to current medical practices, but problems sometimes occur. Call your health care provider if you have any problems or questions after your procedure. °What can I expect after the procedure? °After your procedure, it is typical to have the following sensations: °· Mild discomfort or pain at the insertion site. °· Mild headache that is relieved with pain medicines. ° °Follow these instructions at home: ° °· Avoid lifting anything heavier than 10 lb (4.5 kg) for at least 12 hours after the procedure. °· Drink enough fluids to keep your urine clear or pale yellow. °Contact a health care provider if: °· You have fever or chills. °· You have nausea or vomiting. °· You have a headache that lasts for more than 2 days. °Get help right away if: °· You have any numbness or tingling in your legs. °· You are unable to control your bowel or bladder. °· You have bleeding or swelling in your back at the insertion site. °· You are dizzy or faint. °This information is not intended to replace advice given to you by your health care provider. Make sure you discuss any questions you have with your health care provider. °Document Released: 07/26/2013 Document Revised: 12/27/2015 Document Reviewed: 03/29/2013 °Elsevier Interactive Patient Education © 2017 [employer] ° °

## 2018-03-09 NOTE — Progress Notes (Signed)
Pt stable after lp.VSS,back stable.F/U with her M.D. D/C instructions given.

## 2018-03-10 LAB — IGG CSF INDEX
Albumin CSF-mCnc: 33 mg/dL (ref 11–48)
Albumin: 4 g/dL (ref 3.5–5.5)
IGG (IMMUNOGLOBIN G), SERUM: 1292 mg/dL (ref 700–1600)
IGG INDEX CSF: 1.1 — AB (ref 0.0–0.7)
IgG, CSF: 11.2 mg/dL — ABNORMAL HIGH (ref 0.0–8.6)
IgG/Alb Ratio, CSF: 0.34 — ABNORMAL HIGH (ref 0.00–0.25)

## 2018-03-12 LAB — CSF CULTURE

## 2018-03-12 LAB — CSF CULTURE W GRAM STAIN: Culture: NO GROWTH

## 2018-03-12 LAB — OLIGOCLONAL BANDS, CSF + SERM

## 2018-03-13 LAB — MISC LABCORP TEST (SEND OUT): LABCORP TEST CODE: 139370

## 2018-03-13 LAB — HSV(HERPES SMPLX VRS)ABS-I+II(IGG)-CSF: HSV Type I/II Ab, IgG CSF: 6.79 IV — ABNORMAL HIGH (ref <=0.89)

## 2018-03-17 ENCOUNTER — Other Ambulatory Visit: Payer: Self-pay | Admitting: Neurology

## 2018-03-17 DIAGNOSIS — G35 Multiple sclerosis: Secondary | ICD-10-CM

## 2018-03-22 ENCOUNTER — Other Ambulatory Visit: Payer: Self-pay | Admitting: Gastroenterology

## 2018-03-22 DIAGNOSIS — R1314 Dysphagia, pharyngoesophageal phase: Secondary | ICD-10-CM

## 2018-03-22 DIAGNOSIS — R1013 Epigastric pain: Secondary | ICD-10-CM

## 2018-03-23 ENCOUNTER — Other Ambulatory Visit: Payer: Self-pay | Admitting: Gastroenterology

## 2018-03-23 DIAGNOSIS — R1314 Dysphagia, pharyngoesophageal phase: Secondary | ICD-10-CM

## 2018-03-26 ENCOUNTER — Ambulatory Visit: Admission: RE | Admit: 2018-03-26 | Payer: Medicare HMO | Source: Ambulatory Visit

## 2018-05-03 ENCOUNTER — Ambulatory Visit
Admission: RE | Admit: 2018-05-03 | Discharge: 2018-05-03 | Disposition: A | Discharge status: Home / Self Care | Payer: Medicare HMO | Source: Ambulatory Visit | Attending: Neurology | Admitting: Neurology

## 2018-05-03 DIAGNOSIS — G35 Multiple sclerosis: Secondary | ICD-10-CM | POA: Insufficient documentation

## 2018-05-05 ENCOUNTER — Ambulatory Visit: Payer: Medicare HMO

## 2018-05-12 ENCOUNTER — Ambulatory Visit
Admission: RE | Admit: 2018-05-12 | Discharge: 2018-05-12 | Disposition: A | Discharge status: Home / Self Care | Payer: Medicare HMO | Source: Ambulatory Visit | Attending: Gastroenterology | Admitting: Gastroenterology

## 2018-05-12 DIAGNOSIS — R1314 Dysphagia, pharyngoesophageal phase: Secondary | ICD-10-CM

## 2018-06-11 ENCOUNTER — Ambulatory Visit
Admission: RE | Admit: 2018-06-11 | Discharge: 2018-06-11 | Disposition: A | Discharge status: Home / Self Care | Payer: Medicare HMO | Source: Ambulatory Visit | Attending: Gastroenterology | Admitting: Gastroenterology

## 2018-06-11 ENCOUNTER — Encounter
Admission: RE | Disposition: A | Discharge status: Home / Self Care | Payer: Self-pay | Source: Ambulatory Visit | Attending: Gastroenterology

## 2018-06-11 ENCOUNTER — Encounter: Payer: Self-pay | Admitting: *Deleted

## 2018-06-11 ENCOUNTER — Ambulatory Visit: Payer: Medicare HMO | Admitting: Anesthesiology

## 2018-06-11 DIAGNOSIS — R1310 Dysphagia, unspecified: Secondary | ICD-10-CM | POA: Diagnosis not present

## 2018-06-11 DIAGNOSIS — E785 Hyperlipidemia, unspecified: Secondary | ICD-10-CM | POA: Diagnosis not present

## 2018-06-11 DIAGNOSIS — G35 Multiple sclerosis: Secondary | ICD-10-CM | POA: Insufficient documentation

## 2018-06-11 DIAGNOSIS — Z980 Intestinal bypass and anastomosis status: Secondary | ICD-10-CM | POA: Insufficient documentation

## 2018-06-11 DIAGNOSIS — Z79899 Other long term (current) drug therapy: Secondary | ICD-10-CM | POA: Diagnosis not present

## 2018-06-11 DIAGNOSIS — Z1211 Encounter for screening for malignant neoplasm of colon: Secondary | ICD-10-CM | POA: Insufficient documentation

## 2018-06-11 DIAGNOSIS — Z9884 Bariatric surgery status: Secondary | ICD-10-CM | POA: Insufficient documentation

## 2018-06-11 DIAGNOSIS — D120 Benign neoplasm of cecum: Secondary | ICD-10-CM | POA: Diagnosis not present

## 2018-06-11 DIAGNOSIS — K210 Gastro-esophageal reflux disease with esophagitis: Secondary | ICD-10-CM | POA: Diagnosis not present

## 2018-06-11 DIAGNOSIS — K6389 Other specified diseases of intestine: Secondary | ICD-10-CM | POA: Insufficient documentation

## 2018-06-11 DIAGNOSIS — K5730 Diverticulosis of large intestine without perforation or abscess without bleeding: Secondary | ICD-10-CM | POA: Insufficient documentation

## 2018-06-11 HISTORY — DX: Hyperlipidemia, unspecified: E78.5

## 2018-06-11 HISTORY — DX: Other intervertebral disc degeneration, lumbosacral region: M51.37

## 2018-06-11 HISTORY — DX: Multiple sclerosis: G35

## 2018-06-11 HISTORY — PX: COLONOSCOPY WITH PROPOFOL: SHX5780

## 2018-06-11 HISTORY — PX: ESOPHAGOGASTRODUODENOSCOPY: SHX5428

## 2018-06-11 SURGERY — EGD (ESOPHAGOGASTRODUODENOSCOPY)
Anesthesia: General

## 2018-06-11 MED ORDER — PROPOFOL 10 MG/ML IV BOLUS
INTRAVENOUS | Status: AC
  Filled 2018-06-11: qty 20

## 2018-06-11 MED ORDER — PROPOFOL 500 MG/50ML IV EMUL
INTRAVENOUS | Status: DC | PRN
  Administered 2018-06-11: 160 ug/kg/min via INTRAVENOUS

## 2018-06-11 MED ORDER — GLYCOPYRROLATE 0.2 MG/ML IJ SOLN
INTRAMUSCULAR | Status: DC | PRN
  Administered 2018-06-11: 0.2 mg via INTRAVENOUS

## 2018-06-11 MED ORDER — MIDAZOLAM HCL 2 MG/2ML IJ SOLN
INTRAMUSCULAR | Status: DC | PRN
  Administered 2018-06-11: 2 mg via INTRAVENOUS

## 2018-06-11 MED ORDER — MIDAZOLAM HCL 2 MG/2ML IJ SOLN
INTRAMUSCULAR | Status: AC
  Filled 2018-06-11: qty 2

## 2018-06-11 MED ORDER — ONDANSETRON HCL 4 MG/2ML IJ SOLN
INTRAMUSCULAR | Status: DC | PRN
  Administered 2018-06-11: 4 mg via INTRAVENOUS

## 2018-06-11 MED ORDER — LIDOCAINE HCL (PF) 1 % IJ SOLN
INTRAMUSCULAR | Status: AC
  Filled 2018-06-11: qty 2

## 2018-06-11 MED ORDER — PROPOFOL 10 MG/ML IV BOLUS
INTRAVENOUS | Status: DC | PRN
  Administered 2018-06-11 (×4): 20 mg via INTRAVENOUS

## 2018-06-11 MED ORDER — LIDOCAINE HCL (CARDIAC) PF 100 MG/5ML IV SOSY
PREFILLED_SYRINGE | INTRAVENOUS | Status: DC | PRN
  Administered 2018-06-11: 50 mg via INTRAVENOUS

## 2018-06-11 MED ORDER — MIDAZOLAM HCL (PF) 5 MG/ML IJ SOLN
INTRAMUSCULAR | Status: DC | PRN
  Administered 2018-06-11: 2 mg via INTRAVENOUS

## 2018-06-11 MED ORDER — SODIUM CHLORIDE 0.9 % IV SOLN
INTRAVENOUS | Status: DC
  Administered 2018-06-11: 1000 mL via INTRAVENOUS

## 2018-06-11 NOTE — Anesthesia Preprocedure Evaluation (Addendum)
Anesthesia Evaluation  Patient identified by MRN, date of birth, ID band Patient awake    Reviewed: Allergy & Precautions, NPO status , Patient's Chart, lab work & pertinent test results, reviewed documented beta blocker date and time   Airway Mallampati: III  TM Distance: >3 FB     Dental  (+) Chipped   Pulmonary former smoker,           Cardiovascular      Neuro/Psych    GI/Hepatic   Endo/Other    Renal/GU      Musculoskeletal  (+) Arthritis ,   Abdominal   Peds  Hematology   Anesthesia Other Findings MS. Gastric bypass.obese.  Reproductive/Obstetrics                            Anesthesia Physical Anesthesia Plan  ASA: III  Anesthesia Plan: General   Post-op Pain Management:    Induction: Intravenous  PONV Risk Score and Plan:   Airway Management Planned:   Additional Equipment:   Intra-op Plan:   Post-operative Plan:   Informed Consent: I have reviewed the patients History and Physical, chart, labs and discussed the procedure including the risks, benefits and alternatives for the proposed anesthesia with the patient or authorized representative who has indicated his/her understanding and acceptance.     Plan Discussed with: CRNA  Anesthesia Plan Comments:        Anesthesia Quick Evaluation

## 2018-06-11 NOTE — Anesthesia Postprocedure Evaluation (Signed)
Anesthesia Post Note  Patient: Edwyna Dangerfield  Procedure(s) Performed: ESOPHAGOGASTRODUODENOSCOPY (EGD) (N/A ) COLONOSCOPY WITH PROPOFOL (N/A )  Patient location during evaluation: Endoscopy Anesthesia Type: General Level of consciousness: awake and alert Pain management: pain level controlled Vital Signs Assessment: post-procedure vital signs reviewed and stable Respiratory status: spontaneous breathing, nonlabored ventilation, respiratory function stable and patient connected to nasal cannula oxygen Cardiovascular status: blood pressure returned to baseline and stable Postop Assessment: no apparent nausea or vomiting Anesthetic complications: no     Last Vitals:  Vitals:   06/11/18 1413 06/11/18 1423  BP: 106/72 115/79  Pulse: 61 65  Resp: 15 15  Temp:    SpO2: 100% 100%    Last Pain:  Vitals:   06/11/18 1423  TempSrc:   PainSc: 0-No pain                 Teaghan Formica S

## 2018-06-11 NOTE — Op Note (Addendum)
[facility] Gastroenterology Patient Name: Nancy Duran Procedure Date: 06/11/2018 12:27 PM MRN: [id number] Account #: [id number] Date of Birth: [birth year] Admit Type: Outpatient Age: 57 Room: [facility] ENDO ROOM 3 Gender: Female Note Status: Finalized Procedure:            Upper GI endoscopy Indications:          Dyspepsia, Dysphagia Providers:            Lollie Sails, MD Referring MD:         No Local Md, MD (Referring MD) Medicines:            Monitored Anesthesia Care Complications:        No immediate complications. Procedure:            Pre-Anesthesia Assessment:                       - ASA Grade Assessment: III - A patient with severe                        systemic disease.                       After obtaining informed consent, the endoscope was                        passed under direct vision. Throughout the procedure,                        the patient's blood pressure, pulse, and oxygen                        saturations were monitored continuously. The Endoscope                        was introduced through the mouth, and advanced to the                        efferent jejunal loop. The upper GI endoscopy was                        accomplished without difficulty. The patient tolerated                        the procedure well. The patient tolerated the procedure                        well. Findings:      The Z-line was regular, however mildly friable . Biopsies were taken       with a cold forceps for histology.      Evidence of a Roux-en-Y gastrojejunostomy was found. The gastrojejunal       anastomosis was characterized by healthy appearing mucosa. This was       traversed. The pouch-to-jejunum limb was characterized by healthy       appearing mucosa. The gastric pouch is 5 cm in size. I did not retroflex       in the gastric pouch due to its small size.      The exam of the esophagus was otherwise normal. Impression:           -  Z-line regular, however mildly friable . Biopsied.                       -  Roux-en-Y gastrojejunostomy with gastrojejunal                        anastomosis characterized by healthy appearing mucosa. Recommendation:       - Use sucralfate suspension 1 gram PO BID.                       - Await pathology results.                       - Return to GI clinic in 4 weeks.                       - Use Aciphex (rabeprazole) 20 mg PO daily daily. Procedure Code(s):    --- Professional ---                       [phone], Esophagogastroduodenoscopy, flexible, transoral;                        with biopsy, single or multiple Diagnosis Code(s):    --- Professional ---                       Z98.0, Intestinal bypass and anastomosis status                       R10.13, Epigastric pain                       R13.10, Dysphagia, unspecified CPT copyright 2018 American Medical Association. All rights reserved. The codes documented in this report are preliminary and upon coder review may  be revised to meet current compliance requirements. Lollie Sails, MD 06/11/2018 1:36:51 PM This report has been signed electronically. Number of Addenda: 0 Note Initiated On: 06/11/2018 12:27 PM      [facility]

## 2018-06-11 NOTE — Anesthesia Post-op Follow-up Note (Signed)
Anesthesia QCDR form completed.        

## 2018-06-11 NOTE — Op Note (Addendum)
[facility] Gastroenterology Patient Name: Nancy Duran Procedure Date: 06/11/2018 12:24 PM MRN: [id number] Account #: [id number] Date of Birth: 03-06-1961 Admit Type: Outpatient Age: 57 Room: [facility] ENDO ROOM 3 Gender: Female Note Status: Finalized Procedure:            Colonoscopy Indications:          Screening for colorectal malignant neoplasm Providers:            Lollie Sails, MD Referring MD:         No Local Md, MD (Referring MD) Medicines:            Monitored Anesthesia Care Complications:        No immediate complications. Procedure:            Pre-Anesthesia Assessment:                       - ASA Grade Assessment: III - A patient with severe                        systemic disease.                       After obtaining informed consent, the colonoscope was                        passed under direct vision. Throughout the procedure,                        the patient's blood pressure, pulse, and oxygen                        saturations were monitored continuously. The                        Colonoscope was introduced through the anus and                        advanced to the the cecum, identified by appendiceal                        orifice and ileocecal valve. The colonoscopy was                        performed without difficulty. The patient tolerated the                        procedure well. The quality of the bowel preparation                        was good. Findings:      A diffuse area of minimal melanosis was found in the entire colon.       Biopsies for histology were taken with a cold forceps from the right       colon and left colon for evaluation of microscopic colitis.      A 1 mm polyp was found in the cecum. The polyp was sessile. The polyp       was removed with a cold biopsy forceps. Resection and retrieval were       complete.      A 2 mm polyp was found in the distal sigmoid  colon. The polyp was       sessile.  The polyp was removed with a cold biopsy forceps. Resection and       retrieval were complete.      A few small-mouthed diverticula were found in the sigmoid colon and       descending colon.      The retroflexed view of the distal rectum and anal verge was normal and       showed no anal or rectal abnormalities.      The digital rectal exam was normal. Impression:           - Melanosis in the colon. Biopsied.                       - One 1 mm polyp in the cecum, removed with a cold                        biopsy forceps. Resected and retrieved.                       - One 2 mm polyp in the distal sigmoid colon, removed                        with a cold biopsy forceps. Resected and retrieved.                       - Diverticulosis in the sigmoid colon and in the                        descending colon.                       - The distal rectum and anal verge are normal on                        retroflexion view. Recommendation:       - Discharge patient to home.                       - Miralax 1 capful (17 grams) in 8 ounces of water PO                        daily daily. Procedure Code(s):    --- Professional ---                       [phone], Colonoscopy, flexible; with biopsy, single or                        multiple CPT copyright 2018 American Medical Association. All rights reserved. The codes documented in this report are preliminary and upon coder review may  be revised to meet current compliance requirements. Lollie Sails, MD 06/11/2018 2:01:55 PM This report has been signed electronically. Number of Addenda: 0 Note Initiated On: 06/11/2018 12:24 PM Scope Withdrawal Time: 0 hours 9 minutes 2 seconds  Total Procedure Duration: 0 hours 17 minutes 53 seconds       [facility]

## 2018-06-11 NOTE — Transfer of Care (Signed)
Immediate Anesthesia Transfer of Care Note  Patient: Nancy Duran  Procedure(s) Performed: ESOPHAGOGASTRODUODENOSCOPY (EGD) (N/A ) COLONOSCOPY WITH PROPOFOL (N/A )  Patient Location: PACU  Anesthesia Type:MAC  Level of Consciousness: awake  Airway & Oxygen Therapy: Patient Spontanous Breathing  Post-op Assessment: Report given to RN  Post vital signs: stable  Last Vitals:  Vitals Value Taken Time  BP 97/67 06/11/2018  2:04 PM  Temp 36.2 C 06/11/2018  2:03 PM  Pulse 72 06/11/2018  2:05 PM  Resp 17 06/11/2018  2:05 PM  SpO2 100 % 06/11/2018  2:05 PM  Vitals shown include unvalidated device data.  Last Pain:  Vitals:   06/11/18 1403  TempSrc: Tympanic  PainSc:       Patients Stated Pain Goal: 0 (25/05/39 7673)  Complications: No apparent anesthesia complications

## 2018-06-11 NOTE — H&P (Signed)
Outpatient short stay form Pre-procedure 06/11/2018 12:47 PM Nancy Sails MD  Primary Physician: Dr. Mcneil Sober  Reason for visit: EGD and colonoscopy  History of present illness: Patient is a 57 year old female presenting today for colon cancer screening and EGD in regards to dyspepsia and dysphagia.  She has complained of some epigastric pain that seems to also occur in the right upper quadrant and radiate toward the right.  Does have a strong family history of gallbladder problems.  Not yet been evaluated for gallbladder issues.  She did have a barium swallow and a modified barium swallow both of which were normal.  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  She does take a PPI currently.  She has a remote history of taking daily Aleve until about 3 months ago.  He denies any NSAIDs or Aspirin product.  She takes no blood thinning agent.   Current Facility-Administered Medications:  .  0.9 %  sodium chloride infusion, , Intravenous, Continuous, Nancy Sails, MD  Medications Prior to Admission  Medication Sig Dispense Refill Last Dose  . cholecalciferol (VITAMIN D) 1000 units tablet Take 4,000 Units daily by mouth.    Past Week at Unknown time  . fluticasone (FLONASE) 50 MCG/ACT nasal spray Place 2 sprays into both nostrils daily. 16 g 0 06/10/2018 at Unknown time  . gabapentin (NEURONTIN) 300 MG capsule Take 300 mg by mouth 2 (two) times daily.    06/10/2018 at Unknown time  . magnesium 30 MG tablet Take 30 mg by mouth 2 (two) times daily.   Past Week at Unknown time  . pantoprazole (PROTONIX) 40 MG tablet Take 40 mg by mouth daily.   06/10/2018 at Unknown time  . vitamin B-12 (CYANOCOBALAMIN) 1000 MCG tablet Take 1,000 mcg by mouth daily.   Past Week at Unknown time  . amoxicillin (AMOXIL) 875 MG tablet Take 1 tablet (875 mg total) by mouth 2 (two) times daily. (Patient not taking: Reported on 06/11/2018) 20 tablet 0 Completed Course at Unknown time  . benzonatate  (TESSALON) 200 MG capsule Take 1 capsule (200 mg total) by mouth 3 (three) times daily. (Patient not taking: Reported on 06/11/2018) 30 capsule 0 Completed Course at Unknown time  . doxycycline (VIBRAMYCIN) 100 MG capsule Take 1 capsule (100 mg total) 2 (two) times daily by mouth. (Patient not taking: Reported on 06/11/2018) 20 capsule 0 Completed Course at Unknown time  . Glatiramer Acetate (COPAXONE) 40 MG/ML SOSY Inject into the skin.   Not Taking at Unknown time     No Known Allergies   Past Medical History:  Diagnosis Date  . DDD (degenerative disc disease), lumbosacral 02/05/2018  . Hyperlipidemia   . MS (multiple sclerosis) ([place])    symptoms since 1990, diagnosed 2008    Review of systems:      Physical Exam    Heart and lungs: Regular rate and rhythm without rub or gallop, lungs are bilaterally clear.    HEENT: Normocephalic atraumatic eyes are anicteric    Other:    Pertinant exam for procedure: Soft nontender nondistended bowel sounds positive normoactive.  Mild discomfort to palpation in the epigastric region extending toward the right upper quadrant.    Planned proceedures: EGD, colonoscopy and indicated procedures. I have discussed the risks benefits and complications of procedures to include not limited to bleeding, infection, perforation and the risk of sedation and the patient wishes to proceed.    Nancy Sails, MD Gastroenterology 06/11/2018  12:47  PM

## 2018-06-14 ENCOUNTER — Encounter: Payer: Self-pay | Admitting: Gastroenterology

## 2018-06-15 LAB — SURGICAL PATHOLOGY

## 2018-08-22 ENCOUNTER — Other Ambulatory Visit: Payer: Self-pay

## 2018-08-22 ENCOUNTER — Encounter: Payer: Self-pay | Admitting: Emergency Medicine

## 2018-08-22 ENCOUNTER — Ambulatory Visit
Admission: EM | Admit: 2018-08-22 | Discharge: 2018-08-22 | Disposition: A | Discharge status: Home / Self Care | Payer: Medicare HMO | Attending: Family Medicine | Admitting: Family Medicine

## 2018-08-22 DIAGNOSIS — K047 Periapical abscess without sinus: Secondary | ICD-10-CM | POA: Diagnosis not present

## 2018-08-22 MED ORDER — PENICILLIN V POTASSIUM 500 MG PO TABS: 500.0000 mg | ORAL_TABLET | Freq: Three times a day (TID) | ORAL | 0 refills | Status: DC

## 2018-08-22 MED ORDER — HYDROCODONE-ACETAMINOPHEN 5-325 MG PO TABS: ORAL_TABLET | ORAL | 0 refills | Status: DC

## 2018-08-22 NOTE — ED Triage Notes (Signed)
Patient c/o dental pain and left facial pain that started couple of weeks ago.

## 2018-08-22 NOTE — ED Provider Notes (Signed)
[facility]    CSN: [id number] Arrival date & time: 08/22/18  1527     History   Chief Complaint Chief Complaint  Patient presents with  . Dental Pain    HPI Nancy Duran is a 58 y.o. female.   58 yo female with a c/o left lower dental/molar pain for the past week but worse since last night. States she's planning on seeing a dentist this next week. Denies any fevers, chills.   The history is provided by the patient.  Dental Pain    Past Medical History:  Diagnosis Date  . DDD (degenerative disc disease), lumbosacral 02/05/2018  . Hyperlipidemia   . MS (multiple sclerosis) ([place])    symptoms since 1990, diagnosed 2008    There are no active problems to display for this patient.   Past Surgical History:  Procedure Laterality Date  . ABDOMINAL HYSTERECTOMY    . BREAST EXCISIONAL BIOPSY Left 1990's   NEG  . BREAST SURGERY    . CESAREAN SECTION    . COLONOSCOPY WITH PROPOFOL N/A 06/11/2018   Procedure: COLONOSCOPY WITH PROPOFOL;  Surgeon: Lollie Sails, MD;  Location: [facility] ENDOSCOPY;  Service: Endoscopy;  Laterality: N/A;  . ESOPHAGOGASTRODUODENOSCOPY N/A 06/11/2018   Procedure: ESOPHAGOGASTRODUODENOSCOPY (EGD);  Surgeon: Lollie Sails, MD;  Location: [facility] ENDOSCOPY;  Service: Endoscopy;  Laterality: N/A;  . GASTRIC BYPASS    . TONSILLECTOMY      OB History   No obstetric history on file.      Home Medications    Prior to Admission medications   Medication Sig Start Date End Date Taking? Authorizing Provider  cholecalciferol (VITAMIN D) 1000 units tablet Take 4,000 Units daily by mouth.    Yes [provider]  fluticasone (FLONASE) 50 MCG/ACT nasal spray Place 2 sprays into both nostrils daily. 12/05/15  Yes Frederich Cha, MD  gabapentin (NEURONTIN) 300 MG capsule Take 300 mg by mouth 2 (two) times daily.    Yes [provider]  pantoprazole (PROTONIX) 40 MG tablet Take 40 mg by mouth daily.   Yes [provider]  amoxicillin (AMOXIL) 875 MG tablet Take 1 tablet (875 mg total) by mouth 2 (two) times daily. Patient not taking: Reported on 06/11/2018 09/11/17   Norval Gable, MD  benzonatate (TESSALON) 200 MG capsule Take 1 capsule (200 mg total) by mouth 3 (three) times daily. Patient not taking: Reported on 06/11/2018 09/11/17   Norval Gable, MD  doxycycline (VIBRAMYCIN) 100 MG capsule Take 1 capsule (100 mg total) 2 (two) times daily by mouth. Patient not taking: Reported on 06/11/2018 06/22/17   Marylene Land, NP  Glatiramer Acetate (COPAXONE) 40 MG/ML SOSY Inject into the skin.    [provider]  HYDROcodone-acetaminophen (NORCO/VICODIN) 5-325 MG tablet 1-2 tabs po qd prn 08/22/18   Norval Gable, MD  magnesium 30 MG tablet Take 30 mg by mouth 2 (two) times daily.    [provider]  penicillin v potassium (VEETID) 500 MG tablet Take 1 tablet (500 mg total) by mouth 3 (three) times daily. 08/22/18   Norval Gable, MD  vitamin B-12 (CYANOCOBALAMIN) 1000 MCG tablet Take 1,000 mcg by mouth daily.    [provider]    Family History Family History  Problem Relation Age of Onset  . Breast cancer Sister 90  . Breast cancer Paternal Aunt     Social History Social History   Tobacco Use  . Smoking status: Former Smoker    Last attempt to  quit: 1991    Years since quitting: 29.0  . Smokeless tobacco: Never Used  Substance Use Topics  . Alcohol use: No  . Drug use: No     Allergies   Patient has no known allergies.   Review of Systems Review of Systems   Physical Exam Triage Vital Signs ED Triage Vitals  Enc Vitals Group     BP 08/22/18 1551 125/79     Pulse Rate 08/22/18 1551 62     Resp 08/22/18 1551 16     Temp 08/22/18 1551 98.1 F (36.7 C)     Temp Source 08/22/18 1551 Oral     SpO2 08/22/18 1551 100 %     Weight 08/22/18 1548 200 lb (90.7 kg)     Height 08/22/18 1548 5\' 5"  (1.651 m)     Head Circumference --      Peak Flow --       Pain Score 08/22/18 1548 8     Pain Loc --      Pain Edu? --      Excl. in [place]? --    No data found.  Updated Vital Signs BP 125/79 (BP Location: Left Arm)   Pulse 62   Temp 98.1 F (36.7 C) (Oral)   Resp 16   Ht 5\' 5"  (1.651 m)   Wt 90.7 kg   SpO2 100%   BMI 33.28 kg/m   Visual Acuity Right Eye Distance:   Left Eye Distance:   Bilateral Distance:    Right Eye Near:   Left Eye Near:    Bilateral Near:     Physical Exam Vitals signs and nursing note reviewed.  Constitutional:      General: She is not in acute distress.    Appearance: Normal appearance. She is not ill-appearing, toxic-appearing or diaphoretic.  HENT:     Mouth/Throat:     Dentition: Abnormal dentition. Dental tenderness present.  Neurological:     Mental Status: She is alert.      UC Treatments / Results  Labs (all labs ordered are listed, but only abnormal results are displayed) Labs Reviewed - No data to display  EKG None  Radiology No results found.  Procedures Procedures (including critical care time)  Medications Ordered in UC Medications - No data to display  Initial Impression / Assessment and Plan / UC Course  I have reviewed the triage vital signs and the nursing notes.  Pertinent labs & imaging results that were available during my care of the patient were reviewed by me and considered in my medical decision making (see chart for details).      Final Clinical Impressions(s) / UC Diagnoses   Final diagnoses:  Dental infection    ED Prescriptions    Medication Sig Dispense Auth. Provider   penicillin v potassium (VEETID) 500 MG tablet Take 1 tablet (500 mg total) by mouth 3 (three) times daily. 30 tablet Norval Gable, MD   HYDROcodone-acetaminophen (NORCO/VICODIN) 5-325 MG tablet 1-2 tabs po qd prn 4 tablet Norval Gable, MD      1. diagnosis reviewed with patient 2. rx as per orders above; reviewed possible side effects, interactions, risks and benefits  3.  Recommend supportive treatment with otc analgesics prn  4. Follow-up with dentist   Controlled Substance Prescriptions [place] Controlled Substance Registry consulted? Not Applicable   Norval Gable, MD 08/22/18 [phone]

## 2019-09-23 ENCOUNTER — Other Ambulatory Visit: Payer: Self-pay

## 2019-09-23 ENCOUNTER — Ambulatory Visit (INDEPENDENT_AMBULATORY_CARE_PROVIDER_SITE_OTHER): Payer: Medicare HMO

## 2019-09-23 ENCOUNTER — Encounter: Payer: Self-pay | Admitting: Emergency Medicine

## 2019-09-23 ENCOUNTER — Ambulatory Visit
Admission: EM | Admit: 2019-09-23 | Discharge: 2019-09-23 | Disposition: A | Discharge status: Home / Self Care | Payer: Medicare HMO | Attending: Family Medicine | Admitting: Family Medicine

## 2019-09-23 DIAGNOSIS — R0602 Shortness of breath: Secondary | ICD-10-CM | POA: Diagnosis not present

## 2019-09-23 DIAGNOSIS — R0789 Other chest pain: Secondary | ICD-10-CM | POA: Diagnosis not present

## 2019-09-23 DIAGNOSIS — R0600 Dyspnea, unspecified: Secondary | ICD-10-CM | POA: Diagnosis not present

## 2019-09-23 DIAGNOSIS — J4520 Mild intermittent asthma, uncomplicated: Secondary | ICD-10-CM | POA: Diagnosis not present

## 2019-09-23 MED ORDER — ALBUTEROL SULFATE HFA 108 (90 BASE) MCG/ACT IN AERS: 1.0000 | INHALATION_SPRAY | Freq: Four times a day (QID) | RESPIRATORY_TRACT | 0 refills | Status: AC | PRN

## 2019-09-23 NOTE — Discharge Instructions (Signed)
Rest, fluids Follow up with Primary Care provider

## 2019-09-23 NOTE — ED Triage Notes (Addendum)
Patient states that she has had the SOB and tightness in chest since being diagnosed with COVID last month. Patient c/o SOB and tightness in her chest that has gotten worse over the past couple of days.  Patient was diagnosed with COVID in January.  Patient was tested on 08/31/19 for COVID and result was negative. Patient also reports feeling fatigue.  Patient states that she does have MS. Patient denies fevers.

## 2019-09-23 NOTE — ED Provider Notes (Signed)
[facility]    CSN: [id number] Arrival date & time: 09/23/19  1611      History   Chief Complaint Chief Complaint  Patient presents with  . Shortness of Breath  . Chest Pain    HPI Nancy Duran is a 59 y.o. female.   59 yo female with a c/o shortness of breath since covid-19 diagnosis one month ago. States sometimes feels chest tightness and mild wheezing. Also complains of fatigue. Had blood work done last month by PCP. Patient reports has MS and being treated for this. Denies any cough, fevers, chills.    Shortness of Breath Associated symptoms: chest pain   Chest Pain Associated symptoms: shortness of breath     Past Medical History:  Diagnosis Date  . DDD (degenerative disc disease), lumbosacral 02/05/2018  . Hyperlipidemia   . MS (multiple sclerosis) ([place])    symptoms since 1990, diagnosed 2008    There are no problems to display for this patient.   Past Surgical History:  Procedure Laterality Date  . ABDOMINAL HYSTERECTOMY    . BREAST EXCISIONAL BIOPSY Left 1990's   NEG  . BREAST SURGERY    . CESAREAN SECTION    . COLONOSCOPY WITH PROPOFOL N/A 06/11/2018   Procedure: COLONOSCOPY WITH PROPOFOL;  Surgeon: Lollie Sails, MD;  Location: [facility] ENDOSCOPY;  Service: Endoscopy;  Laterality: N/A;  . ESOPHAGOGASTRODUODENOSCOPY N/A 06/11/2018   Procedure: ESOPHAGOGASTRODUODENOSCOPY (EGD);  Surgeon: Lollie Sails, MD;  Location: [facility] ENDOSCOPY;  Service: Endoscopy;  Laterality: N/A;  . GASTRIC BYPASS    . TONSILLECTOMY      OB History   No obstetric history on file.      Home Medications    Prior to Admission medications   Medication Sig Start Date End Date Taking? Authorizing Provider  cholecalciferol (VITAMIN D) 1000 units tablet Take 4,000 Units daily by mouth.    Yes [provider]  gabapentin (NEURONTIN) 300 MG capsule Take 300 mg by mouth 2 (two) times daily.    Yes [provider]  interferon beta-1a  (AVONEX) 30 MCG injection Inject into the muscle. 05/31/19 05/30/20 Yes [provider]  albuterol (VENTOLIN HFA) 108 (90 Base) MCG/ACT inhaler Inhale 1-2 puffs into the lungs every 6 (six) hours as needed for wheezing or shortness of breath. 09/23/19   Norval Gable, MD  amoxicillin (AMOXIL) 875 MG tablet Take 1 tablet (875 mg total) by mouth 2 (two) times daily. Patient not taking: Reported on 06/11/2018 09/11/17   Norval Gable, MD  benzonatate (TESSALON) 200 MG capsule Take 1 capsule (200 mg total) by mouth 3 (three) times daily. Patient not taking: Reported on 06/11/2018 09/11/17   Norval Gable, MD  doxycycline (VIBRAMYCIN) 100 MG capsule Take 1 capsule (100 mg total) 2 (two) times daily by mouth. Patient not taking: Reported on 06/11/2018 06/22/17   Marylene Land, NP  fluticasone [facility]) 50 MCG/ACT nasal spray Place 2 sprays into both nostrils daily. 12/05/15   Frederich Cha, MD  Glatiramer Acetate (COPAXONE) 40 MG/ML SOSY Inject into the skin.    [provider]  HYDROcodone-acetaminophen (NORCO/VICODIN) 5-325 MG tablet 1-2 tabs po qd prn 08/22/18   Norval Gable, MD  magnesium 30 MG tablet Take 30 mg by mouth 2 (two) times daily.    [provider]  pantoprazole (PROTONIX) 40 MG tablet Take 40 mg by mouth daily.    [provider]  penicillin v potassium (VEETID) 500 MG tablet Take 1 tablet (500 mg total) by  mouth 3 (three) times daily. 08/22/18   Norval Gable, MD  vitamin B-12 (CYANOCOBALAMIN) 1000 MCG tablet Take 1,000 mcg by mouth daily.    [provider]    Family History Family History  Problem Relation Age of Onset  . Breast cancer Sister 47  . Breast cancer Paternal Aunt     Social History Social History   Tobacco Use  . Smoking status: Former Smoker    Quit date: 1991    Years since quitting: 30.1  . Smokeless tobacco: Never Used  Substance Use Topics  . Alcohol use: No  . Drug use: No     Allergies   Patient has no  known allergies.   Review of Systems Review of Systems  Respiratory: Positive for shortness of breath.   Cardiovascular: Positive for chest pain.     Physical Exam Triage Vital Signs ED Triage Vitals  Enc Vitals Group     BP 09/23/19 1633 122/75     Pulse Rate 09/23/19 1633 64     Resp 09/23/19 1633 14     Temp 09/23/19 1633 98.2 F (36.8 C)     Temp Source 09/23/19 1633 Oral     SpO2 09/23/19 1633 100 %     Weight 09/23/19 1628 200 lb (90.7 kg)     Height 09/23/19 1628 5\' 5"  (1.651 m)     Head Circumference --      Peak Flow --      Pain Score 09/23/19 1627 6     Pain Loc --      Pain Edu? --      Excl. in [place]? --    No data found.  Updated Vital Signs BP 122/75 (BP Location: Left Arm)   Pulse 64   Temp 98.2 F (36.8 C) (Oral)   Resp 14   Ht 5\' 5"  (1.651 m)   Wt 90.7 kg   SpO2 100%   BMI 33.28 kg/m   Visual Acuity Right Eye Distance:   Left Eye Distance:   Bilateral Distance:    Right Eye Near:   Left Eye Near:    Bilateral Near:     Physical Exam Vitals and nursing note reviewed.  Constitutional:      General: She is not in acute distress.    Appearance: She is not toxic-appearing or diaphoretic.  Cardiovascular:     Rate and Rhythm: Normal rate and regular rhythm.     Heart sounds: Normal heart sounds.  Pulmonary:     Effort: Pulmonary effort is normal. No respiratory distress.     Breath sounds: Normal breath sounds. No stridor. No wheezing, rhonchi or rales.  Neurological:     Mental Status: She is alert.      UC Treatments / Results  Labs (all labs ordered are listed, but only abnormal results are displayed) Labs Reviewed - No data to display  EKG   Radiology DG Chest 2 View  Result Date: 09/23/2019 CLINICAL DATA:  Shortness of breath and chest tightness. EXAM: CHEST - 2 VIEW COMPARISON:  None. FINDINGS: The heart size and mediastinal contours are within normal limits. The lungs are clear. The visualized skeletal structures are  unremarkable. IMPRESSION: No acute disease. Electronically Signed   By: Inge Rise M.D.   On: 09/23/2019 17:12    Procedures Procedures (including critical care time)  Medications Ordered in UC Medications - No data to display  Initial Impression / Assessment and Plan / UC Course  I have reviewed the  triage vital signs and the nursing notes.  Pertinent labs & imaging results that were available during my care of the patient were reviewed by me and considered in my medical decision making (see chart for details).     Final Clinical Impressions(s) / UC Diagnoses   Final diagnoses:  Dyspnea, unspecified type  Mild intermittent reactive airway disease without complication     Discharge Instructions     Rest, fluids Follow up with Primary Care provider    ED Prescriptions    Medication Sig Dispense Auth. Provider   albuterol (VENTOLIN HFA) 108 (90 Base) MCG/ACT inhaler Inhale 1-2 puffs into the lungs every 6 (six) hours as needed for wheezing or shortness of breath. 8 g Norval Gable, MD      1. ekg/x-ray results and diagnosis reviewed with patient 2. rx as per orders above; reviewed possible side effects, interactions, risks and benefits  3. Recommend supportive treatment as above  4. Follow-up prn if symptoms worsen or don't improve   PDMP not reviewed this encounter.   Norval Gable, MD 09/23/19 2035

## 2019-10-27 ENCOUNTER — Other Ambulatory Visit: Payer: Self-pay | Admitting: Otolaryngology

## 2019-10-27 DIAGNOSIS — R1011 Right upper quadrant pain: Secondary | ICD-10-CM

## 2019-11-01 ENCOUNTER — Encounter (INDEPENDENT_AMBULATORY_CARE_PROVIDER_SITE_OTHER): Payer: Self-pay

## 2019-11-01 ENCOUNTER — Other Ambulatory Visit: Payer: Self-pay

## 2019-11-01 ENCOUNTER — Ambulatory Visit
Admission: RE | Admit: 2019-11-01 | Discharge: 2019-11-01 | Disposition: A | Discharge status: Home / Self Care | Payer: Medicare HMO | Source: Ambulatory Visit | Attending: Otolaryngology | Admitting: Otolaryngology

## 2019-11-01 DIAGNOSIS — R1011 Right upper quadrant pain: Secondary | ICD-10-CM | POA: Insufficient documentation

## 2020-04-15 IMAGING — CR DG CHEST 2V
2 series · 2 of 2 positions shown · non-contrast
Comparison: None.

CLINICAL DATA: Shortness of breath and chest tightness.

EXAM:
CHEST - 2 VIEW

[chest pa]
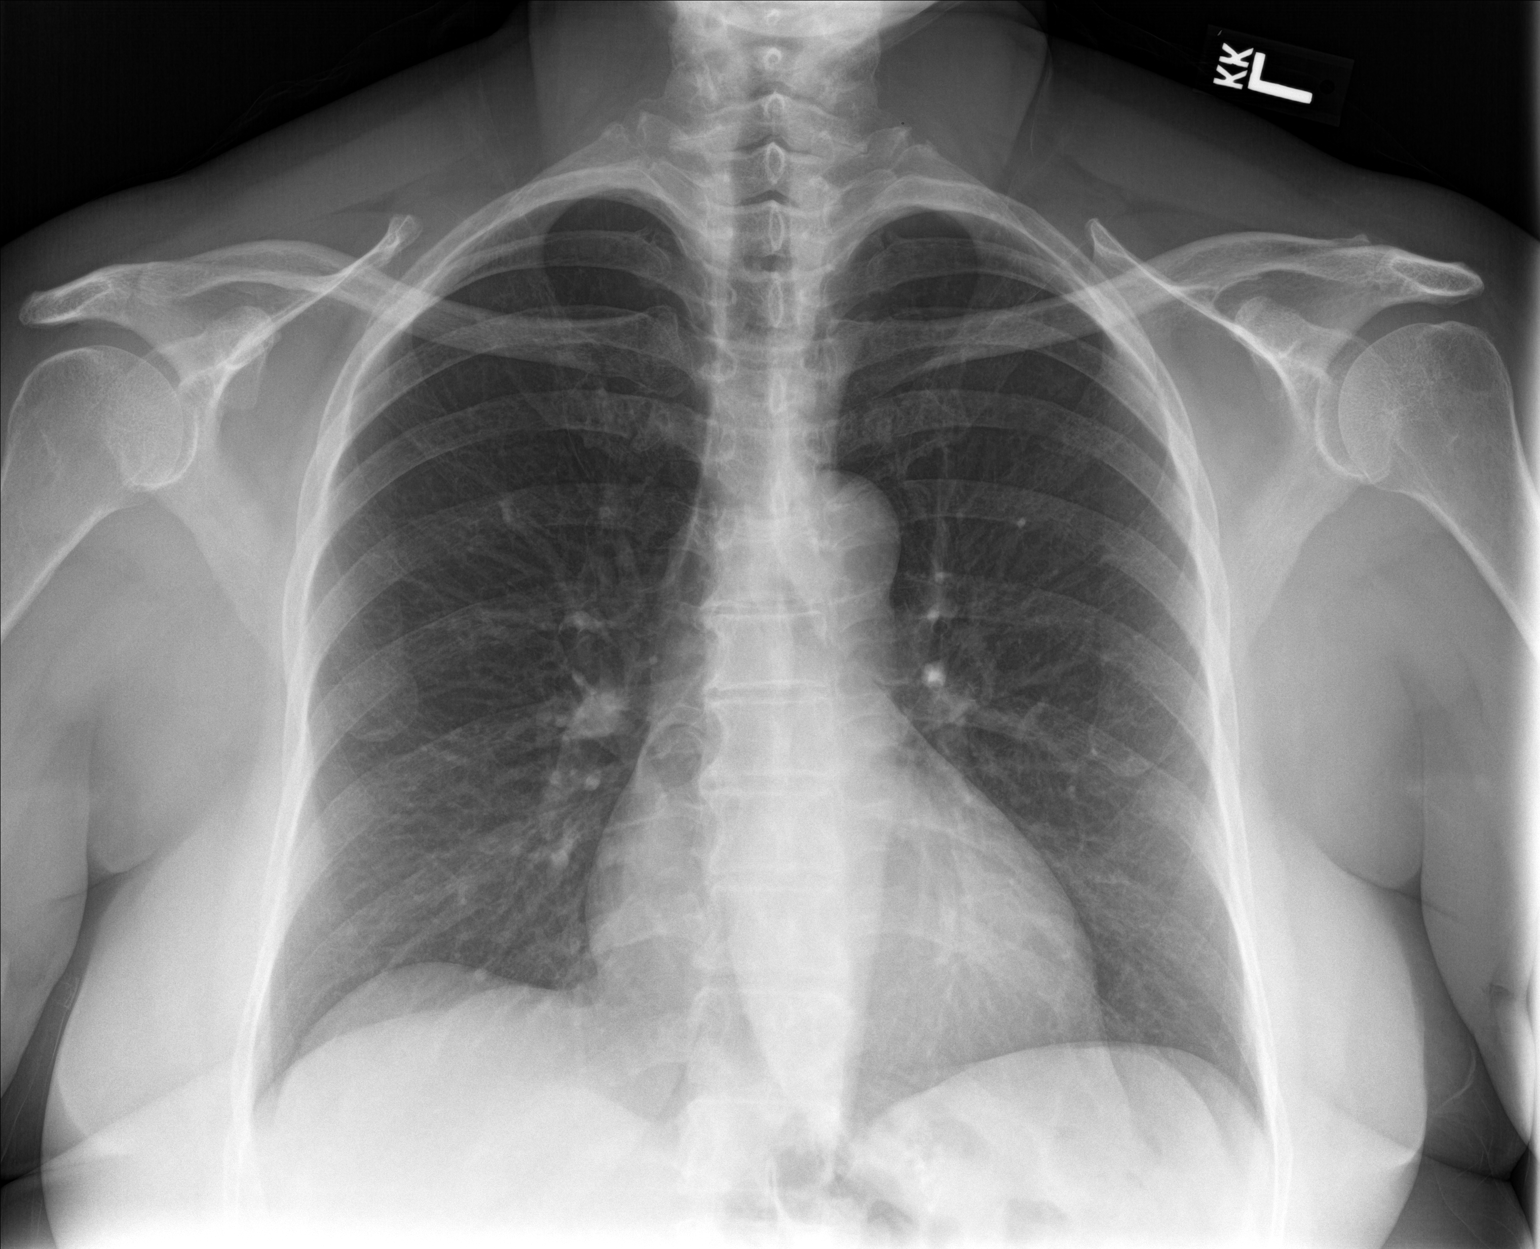

[chest lat]
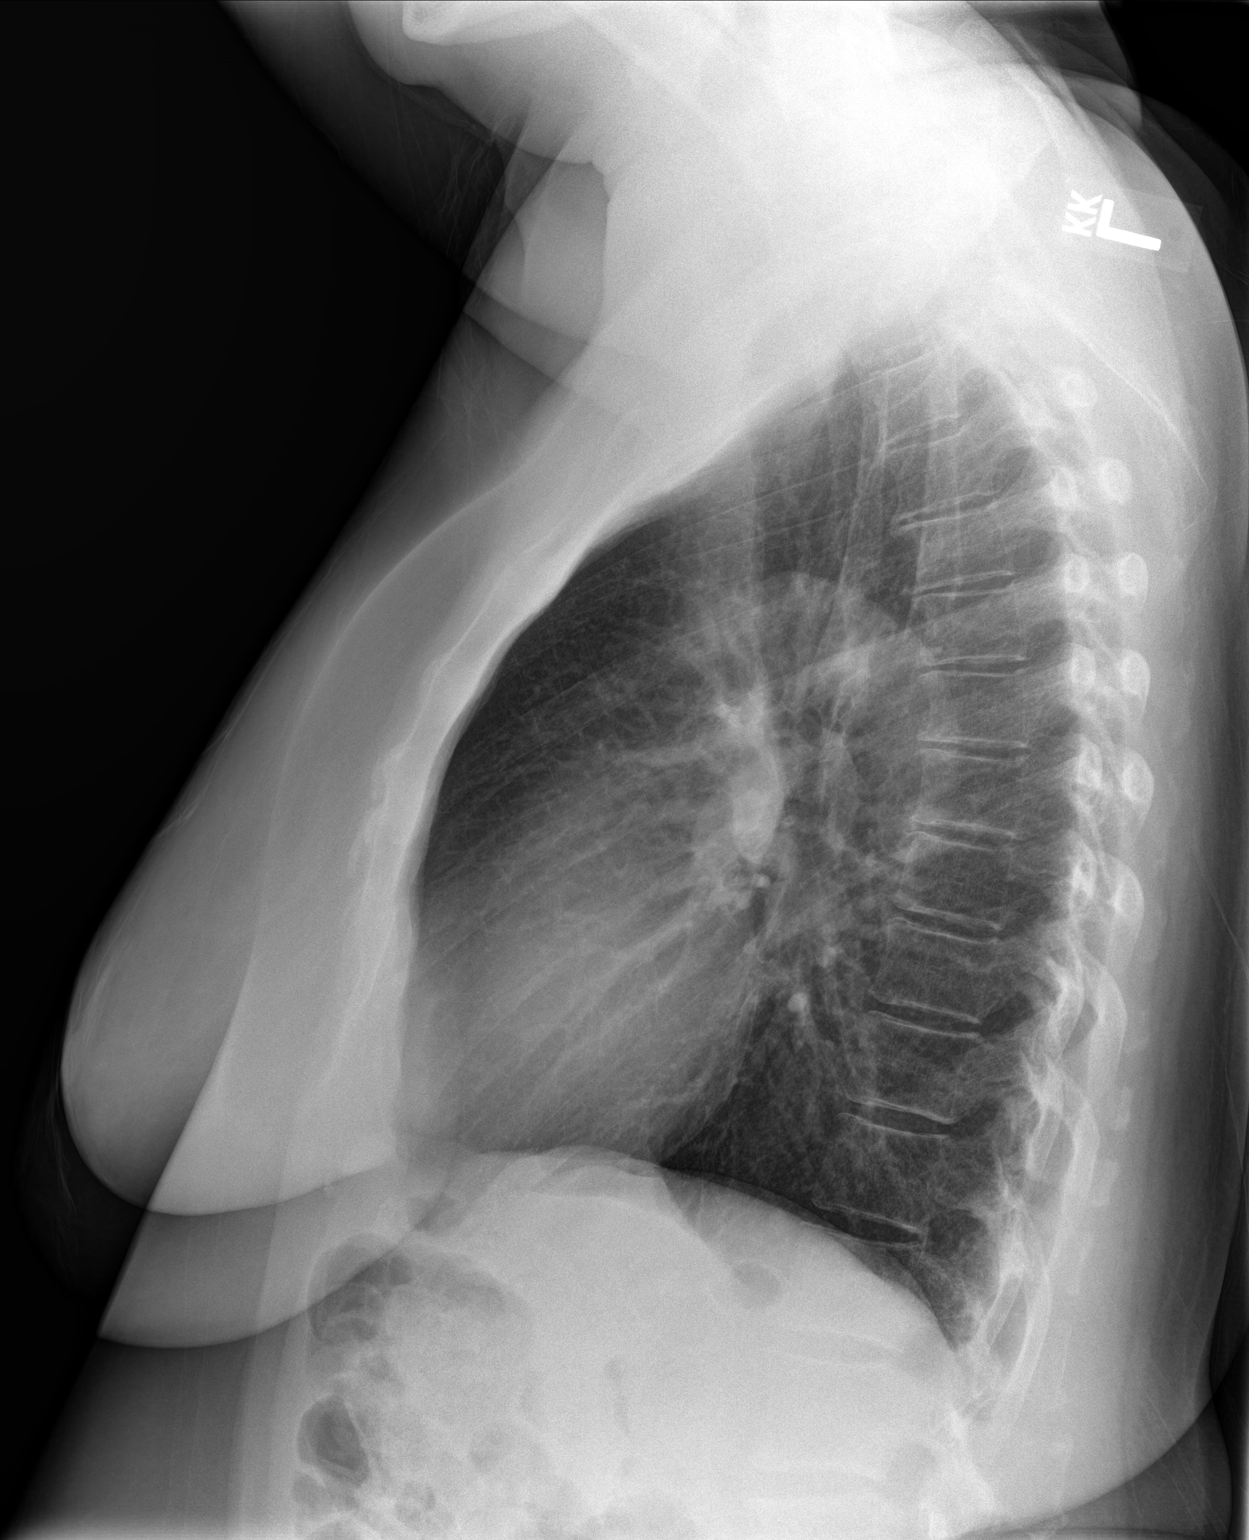

[2 of 2 positions shown; findings below may reference images not displayed]

FINDINGS: The heart size and mediastinal contours are within normal limits.
The lungs are clear. The visualized skeletal structures are
unremarkable.
IMPRESSION: No acute disease.

## 2020-05-24 IMAGING — US US ABDOMEN LIMITED
1 series · 14 of 25 positions shown · non-contrast
Comparison: None.

CLINICAL DATA: Worsening chronic right upper quadrant pain.

EXAM:
ULTRASOUND ABDOMEN LIMITED RIGHT UPPER QUADRANT

[Series 1: us abdomen limited · 0.25mm/px · 14 of 43 slices shown]
[im 1/43]
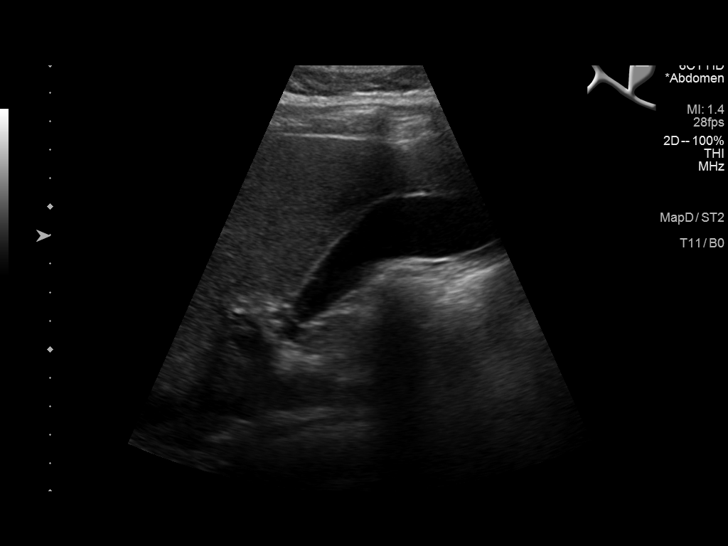
[im 4/43]
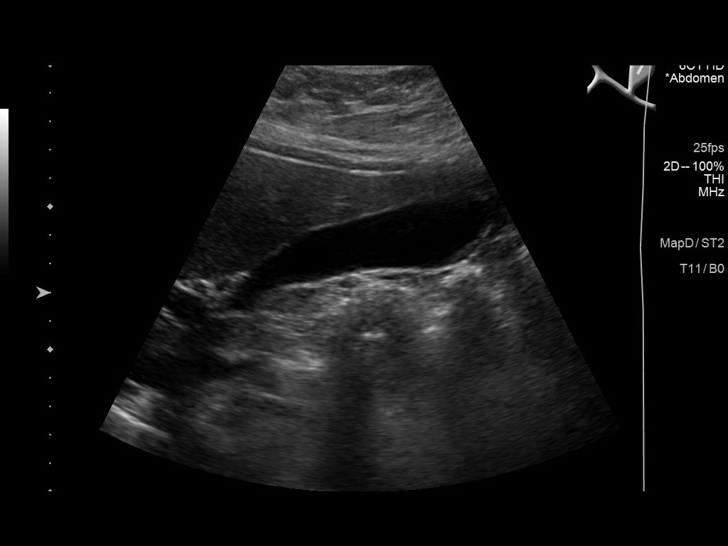
[im 8/43]
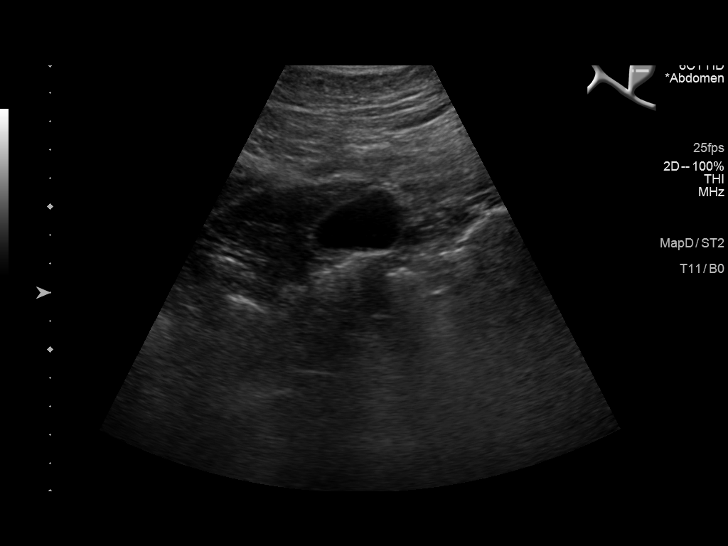
[im 11/43]
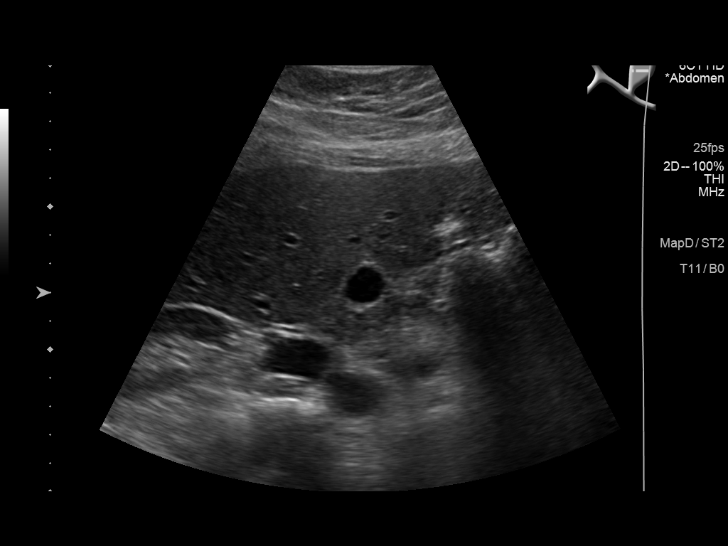
[im 15/43]
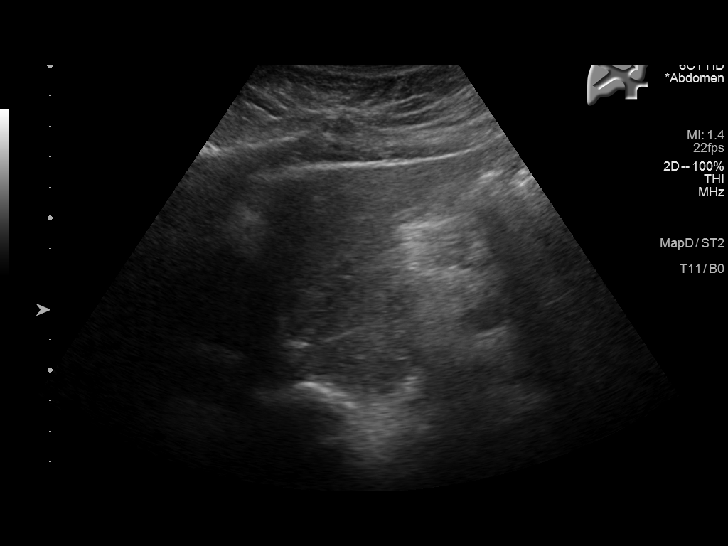
[im 16/43]
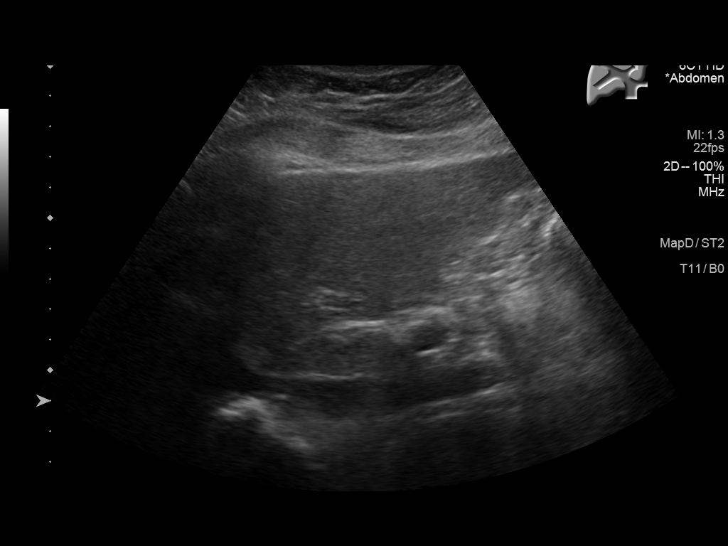
[im 20/43]
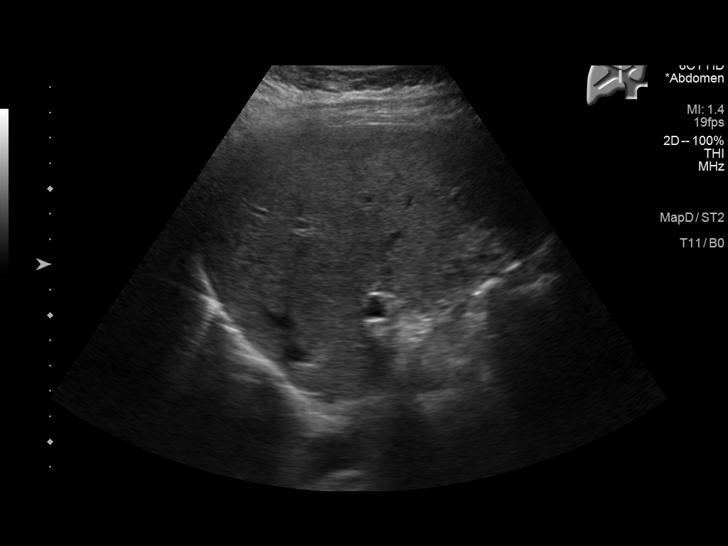
[im 23/43]
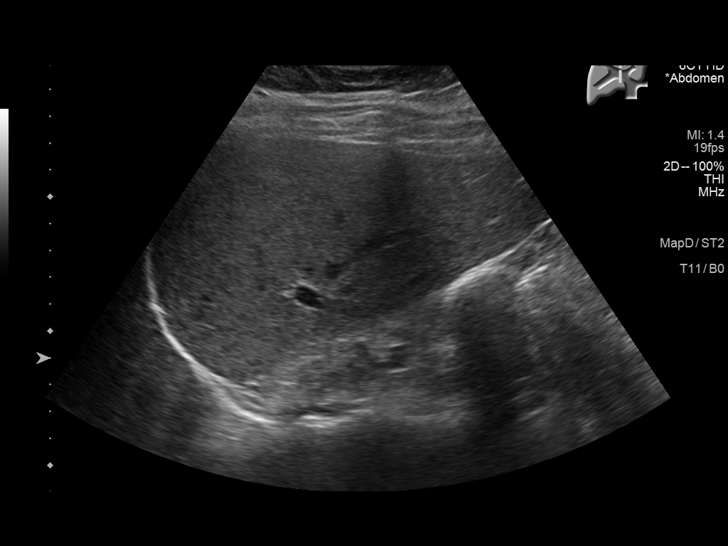
[im 27/43]
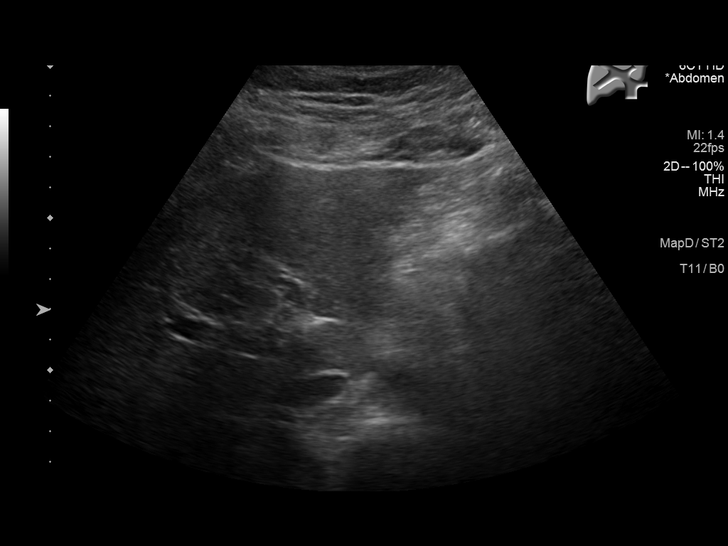
[im 29/43]
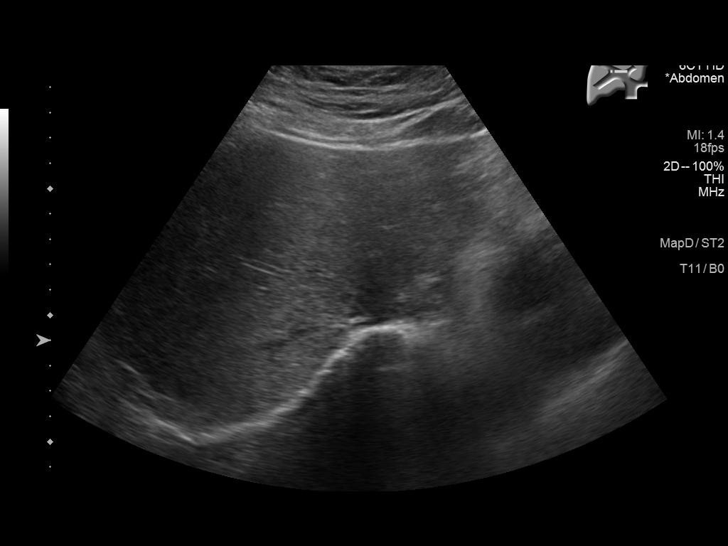
[im 32/43]
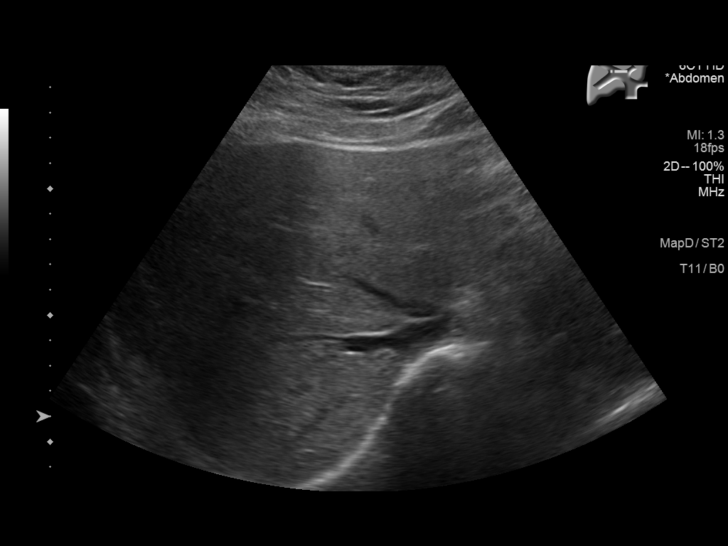
[im 36/43]
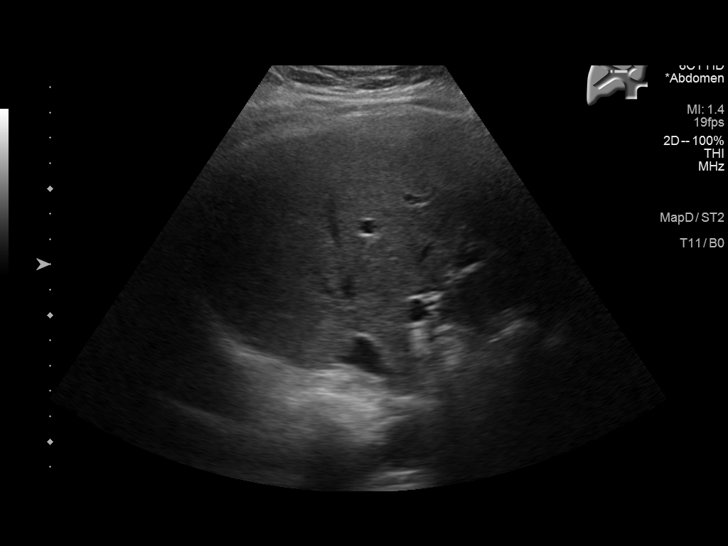
[im 39/43]
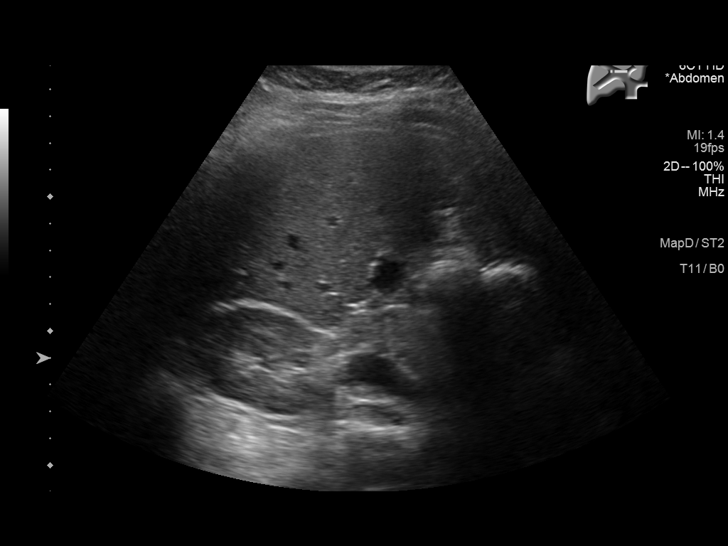
[im 43/43]
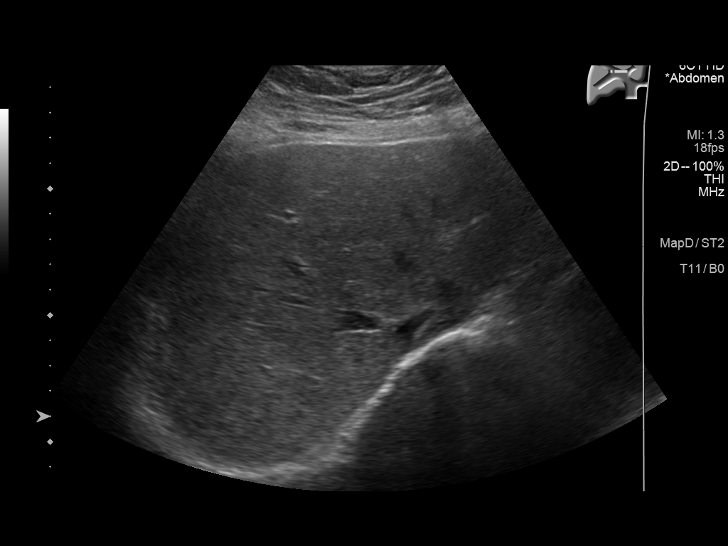

[14 of 25 positions shown; findings below may reference images not displayed]

FINDINGS: Gallbladder:

No gallstones or wall thickening visualized. No sonographic Murphy
sign noted by sonographer.

Common bile duct:

Diameter: 0.2 cm

Liver:

No focal lesion identified. Within normal limits in parenchymal
echogenicity. Portal vein is patent on color Doppler imaging with
normal direction of blood flow towards the liver.

Other: None.
IMPRESSION: Normal exam.

## 2020-07-09 ENCOUNTER — Other Ambulatory Visit: Payer: Self-pay

## 2020-07-09 DIAGNOSIS — Z1231 Encounter for screening mammogram for malignant neoplasm of breast: Secondary | ICD-10-CM

## 2020-07-31 ENCOUNTER — Other Ambulatory Visit: Payer: Self-pay

## 2020-07-31 ENCOUNTER — Ambulatory Visit
Admission: RE | Admit: 2020-07-31 | Discharge: 2020-07-31 | Disposition: A | Discharge status: Home / Self Care | Payer: Medicare HMO | Source: Ambulatory Visit

## 2020-07-31 DIAGNOSIS — Z1231 Encounter for screening mammogram for malignant neoplasm of breast: Secondary | ICD-10-CM

## 2020-08-01 ENCOUNTER — Other Ambulatory Visit: Payer: Self-pay

## 2020-08-07 ENCOUNTER — Other Ambulatory Visit: Payer: Self-pay

## 2020-08-07 DIAGNOSIS — R928 Other abnormal and inconclusive findings on diagnostic imaging of breast: Secondary | ICD-10-CM

## 2020-08-07 DIAGNOSIS — N6489 Other specified disorders of breast: Secondary | ICD-10-CM

## 2020-08-22 ENCOUNTER — Ambulatory Visit
Admission: RE | Admit: 2020-08-22 | Discharge: 2020-08-22 | Disposition: A | Discharge status: Home / Self Care | Payer: Medicare HMO | Source: Ambulatory Visit

## 2020-08-22 ENCOUNTER — Other Ambulatory Visit: Payer: Self-pay

## 2020-08-22 DIAGNOSIS — R928 Other abnormal and inconclusive findings on diagnostic imaging of breast: Secondary | ICD-10-CM | POA: Diagnosis not present

## 2020-08-22 DIAGNOSIS — N6489 Other specified disorders of breast: Secondary | ICD-10-CM

## 2020-11-13 ENCOUNTER — Other Ambulatory Visit: Payer: Self-pay

## 2020-11-13 ENCOUNTER — Ambulatory Visit
Admission: RE | Admit: 2020-11-13 | Discharge: 2020-11-13 | Disposition: A | Discharge status: Home / Self Care | Payer: Medicare HMO | Source: Ambulatory Visit | Attending: Family Medicine | Admitting: Family Medicine

## 2020-11-13 VITALS — BP 109/71 | HR 87 | Temp 99.6°F | Resp 16

## 2020-11-13 DIAGNOSIS — U071 COVID-19: Secondary | ICD-10-CM | POA: Insufficient documentation

## 2020-11-13 LAB — RESP PANEL BY RT-PCR (FLU A&B, COVID) ARPGX2
Influenza A by PCR: NEGATIVE
Influenza B by PCR: NEGATIVE
SARS Coronavirus 2 by RT PCR: POSITIVE — AB

## 2020-11-13 MED ORDER — MOLNUPIRAVIR 200 MG PO CAPS: 800.0000 mg | ORAL_CAPSULE | Freq: Two times a day (BID) | ORAL | 0 refills | Status: AC

## 2020-11-13 NOTE — ED Triage Notes (Signed)
Patient presents to Urgent Care with complaints of cough, fever, and HA since Sunday. She states on Saturday she had allergy like symptoms. Treating symptoms with tylenol, Theraflu, delsym, and tea with honey. Pt states she had a negative covid test x 2 days.

## 2020-11-13 NOTE — ED Provider Notes (Signed)
[facility]    CSN: [id number] Arrival date & time: 11/13/20  1442      History   Chief Complaint Chief Complaint  Patient presents with  . Cough  . Fever  . Headache  . Appointment    1500 appt    HPI   60 year old female presents with the above complaints.  Patient reports that her symptoms started on Saturday and subsequently worsened on Sunday.  She reports cough, fever, body aches, sore throat.  Sounds congested.  Pain 4/10 in severity.  She has been treating herself with over-the-counter Tylenol, TheraFlu, Delsym, tea with honey.  She is recently had negative Covid testing.  No relieving factors.  No other reported symptoms.  No other complaints.  Past Medical History:  Diagnosis Date  . DDD (degenerative disc disease), lumbosacral 02/05/2018  . Hyperlipidemia   . MS (multiple sclerosis) ([place])    symptoms since 1990, diagnosed 2008   Past Surgical History:  Procedure Laterality Date  . ABDOMINAL HYSTERECTOMY    . BREAST EXCISIONAL BIOPSY Left 1990's   NEG  . BREAST SURGERY    . CESAREAN SECTION    . COLONOSCOPY WITH PROPOFOL N/A 06/11/2018   Procedure: COLONOSCOPY WITH PROPOFOL;  Surgeon: Lollie Sails, MD;  Location: [facility] ENDOSCOPY;  Service: Endoscopy;  Laterality: N/A;  . ESOPHAGOGASTRODUODENOSCOPY N/A 06/11/2018   Procedure: ESOPHAGOGASTRODUODENOSCOPY (EGD);  Surgeon: Lollie Sails, MD;  Location: [facility] ENDOSCOPY;  Service: Endoscopy;  Laterality: N/A;  . GASTRIC BYPASS    . TONSILLECTOMY      OB History   No obstetric history on file.      Home Medications    Prior to Admission medications   Medication Sig Start Date End Date Taking? Authorizing Provider  Molnupiravir 200 MG CAPS Take 4 capsules (800 mg total) by mouth in the morning and at bedtime for 5 days. 11/13/20 11/18/20 Yes Roberto Romanoski G, DO  Ocrelizumab (OCREVUS IV) Inject into the vein.   Yes [provider]  albuterol (VENTOLIN HFA) 108 (90 Base) MCG/ACT  inhaler Inhale 1-2 puffs into the lungs every 6 (six) hours as needed for wheezing or shortness of breath. 09/23/19   Norval Gable, MD  cholecalciferol (VITAMIN D) 1000 units tablet Take 4,000 Units daily by mouth.     [provider]  fluticasone (FLONASE) 50 MCG/ACT nasal spray Place 2 sprays into both nostrils daily. 12/05/15   Frederich Cha, MD  gabapentin (NEURONTIN) 300 MG capsule Take 300 mg by mouth 3 (three) times daily.    [provider]  Glatiramer Acetate 40 MG/ML SOSY Inject into the skin.    [provider]  HYDROcodone-acetaminophen (NORCO/VICODIN) 5-325 MG tablet 1-2 tabs po qd prn 08/22/18   Norval Gable, MD  interferon beta-1a (AVONEX) 30 MCG injection Inject into the muscle. 05/31/19 05/30/20  [provider]  Loratadine 10 MG CAPS Take by mouth.    [provider]  magnesium 30 MG tablet Take 30 mg by mouth 2 (two) times daily.    [provider]  meloxicam (MOBIC) 15 MG tablet Take 1 tablet by mouth daily. 11/01/20   [provider]  pantoprazole (PROTONIX) 40 MG tablet Take 40 mg by mouth daily.    [provider]  penicillin v potassium (VEETID) 500 MG tablet Take 1 tablet (500 mg total) by mouth 3 (three) times daily. 08/22/18   Norval Gable, MD  vitamin B-12 (CYANOCOBALAMIN) 1000 MCG tablet Take 1,000 mcg by mouth daily.  [provider]    Family History Family History  Problem Relation Age of Onset  . Breast cancer Sister 1  . Breast cancer Paternal Aunt   . Breast cancer Cousin        pat cousin    Social History Social History   Tobacco Use  . Smoking status: Former Smoker    Quit date: 1991    Years since quitting: 31.2  . Smokeless tobacco: Never Used  Vaping Use  . Vaping Use: Never used  Substance Use Topics  . Alcohol use: No  . Drug use: No     Allergies   Patient has no known allergies.   Review of Systems Review of Systems Per HPI  Physical  Exam Triage Vital Signs ED Triage Vitals  Enc Vitals Group     BP 11/13/20 1458 109/71     Pulse Rate 11/13/20 1458 87     Resp 11/13/20 1458 16     Temp 11/13/20 1458 99.6 F (37.6 C)     Temp Source 11/13/20 1458 Oral     SpO2 11/13/20 1458 98 %     Weight --      Height --      Head Circumference --      Peak Flow --      Pain Score 11/13/20 1452 4     Pain Loc --      Pain Edu? --      Excl. in [place]? --    Updated Vital Signs BP 109/71 (BP Location: Left Arm)   Pulse 87   Temp 99.6 F (37.6 C) (Oral)   Resp 16   SpO2 98%   Visual Acuity Right Eye Distance:   Left Eye Distance:   Bilateral Distance:    Right Eye Near:   Left Eye Near:    Bilateral Near:     Physical Exam Vitals and nursing note reviewed.  Constitutional:      General: She is not in acute distress.    Appearance: Normal appearance. She is not ill-appearing.  HENT:     Head: Normocephalic and atraumatic.     Right Ear: Tympanic membrane normal.     Left Ear: Tympanic membrane normal.     Nose: Congestion present.     Mouth/Throat:     Pharynx: Oropharynx is clear.  Cardiovascular:     Rate and Rhythm: Normal rate and regular rhythm.  Pulmonary:     Effort: Pulmonary effort is normal.     Breath sounds: Normal breath sounds. No wheezing, rhonchi or rales.  Neurological:     Mental Status: She is alert.  Psychiatric:        Mood and Affect: Mood normal.        Behavior: Behavior normal.    UC Treatments / Results  Labs (all labs ordered are listed, but only abnormal results are displayed) Labs Reviewed  RESP PANEL BY RT-PCR (FLU A&B, COVID) ARPGX2 - Abnormal; Notable for the following components:      Result Value   SARS Coronavirus 2 by RT PCR POSITIVE (*)    All other components within normal limits    EKG   Radiology No results found.  Procedures Procedures (including critical care time)  Medications Ordered in UC Medications - No data to display  Initial Impression /  Assessment and Plan / UC Course  I have reviewed the triage vital signs and the nursing notes.  Pertinent labs & imaging results that were available  during my care of the patient were reviewed by me and considered in my medical decision making (see chart for details).    61 year old female presents with COVID-19.  This is an acute illness with systemic symptoms as she has had fever.  Patient is immunocompromised in the setting of treatment for MS.  She is high risk.  As a result, placing on Molnupiravir.   Final Clinical Impressions(s) / UC Diagnoses   Final diagnoses:  COVID     Discharge Instructions     You have COVID.  Antiviral as prescribed.  If you worsen, go to the ER.  Take care  Dr. Lacinda Axon    ED Prescriptions    Medication Sig Dispense Auth. Provider   Molnupiravir 200 MG CAPS Take 4 capsules (800 mg total) by mouth in the morning and at bedtime for 5 days. 40 capsule Coral Spikes, DO     PDMP not reviewed this encounter.   Coral Spikes, [place] 11/13/20 1635

## 2020-11-13 NOTE — Discharge Instructions (Signed)
You have COVID.  Antiviral as prescribed.  If you worsen, go to the ER.  Take care  Dr. Lacinda Axon

## 2021-03-27 ENCOUNTER — Other Ambulatory Visit: Payer: Self-pay

## 2021-03-27 ENCOUNTER — Ambulatory Visit
Admission: RE | Admit: 2021-03-27 | Discharge: 2021-03-27 | Disposition: A | Discharge status: Home / Self Care | Payer: Medicare HMO | Source: Ambulatory Visit | Attending: Physician Assistant | Admitting: Physician Assistant

## 2021-03-27 VITALS — BP 115/63 | HR 72 | Temp 98.7°F | Resp 18 | Ht 65.0 in | Wt 200.0 lb

## 2021-03-27 DIAGNOSIS — W57XXXA Bitten or stung by nonvenomous insect and other nonvenomous arthropods, initial encounter: Secondary | ICD-10-CM | POA: Diagnosis not present

## 2021-03-27 DIAGNOSIS — M549 Dorsalgia, unspecified: Secondary | ICD-10-CM | POA: Diagnosis not present

## 2021-03-27 DIAGNOSIS — R5383 Other fatigue: Secondary | ICD-10-CM | POA: Diagnosis not present

## 2021-03-27 DIAGNOSIS — R21 Rash and other nonspecific skin eruption: Secondary | ICD-10-CM

## 2021-03-27 MED ORDER — TRIAMCINOLONE ACETONIDE 0.5 % EX OINT: 1.0000 "application " | TOPICAL_OINTMENT | Freq: Two times a day (BID) | CUTANEOUS | 0 refills | Status: AC

## 2021-03-27 MED ORDER — HYDROXYZINE HCL 25 MG PO TABS: 25.0000 mg | ORAL_TABLET | Freq: Four times a day (QID) | ORAL | 0 refills | Status: AC | PRN

## 2021-03-27 NOTE — ED Triage Notes (Signed)
Pt c/o rash or insect bites on her bilateral legs, bilateral arms and chest. Started about 5 days ago. She states she has tried benadryl. Rash is very itchy.

## 2021-03-27 NOTE — ED Provider Notes (Signed)
[facility]    CSN: [id number] Arrival date & time: 03/27/21  1722      History   Chief Complaint Chief Complaint  Patient presents with   Insect Bite   Rash    HPI Nancy Duran is a 60 y.o. female presenting for concerns about red and itchy bumps on her lower legs, arms and chest.  Patient says she was bit or stung by something while she was in her relatives home down in [place].  She states she developed red bumps on her lower legs.  He says that she went out to the pool after that and started to feel itchy all over so she started scratching her body and seemed to develop more bumps on her upper body.  She has taken Benadryl occasionally but that has not helped with the itching.  She says she is also been trying to keep the areas clean.  She does admit to continue to scratch the areas.  Denies any pain.  No fevers but has had some increased fatigue.  Patient does not recall what bit or stung her.  No similar rashes or symptoms in any relatives.  No other complaints or concerns.  HPI  Past Medical History:  Diagnosis Date   DDD (degenerative disc disease), lumbosacral 02/05/2018   Hyperlipidemia    MS (multiple sclerosis) (HCC)    symptoms since 1990, diagnosed 2008    There are no problems to display for this patient.   Past Surgical History:  Procedure Laterality Date   ABDOMINAL HYSTERECTOMY     BREAST EXCISIONAL BIOPSY Left 1990's   NEG   BREAST SURGERY     CESAREAN SECTION     COLONOSCOPY WITH PROPOFOL N/A 06/11/2018   Procedure: COLONOSCOPY WITH PROPOFOL;  Surgeon: Lollie Sails, MD;  Location: [facility] ENDOSCOPY;  Service: Endoscopy;  Laterality: N/A;   ESOPHAGOGASTRODUODENOSCOPY N/A 06/11/2018   Procedure: ESOPHAGOGASTRODUODENOSCOPY (EGD);  Surgeon: Lollie Sails, MD;  Location: [facility] ENDOSCOPY;  Service: Endoscopy;  Laterality: N/A;   GASTRIC BYPASS     TONSILLECTOMY      OB History   No obstetric history on file.      Home  Medications    Prior to Admission medications   Medication Sig Start Date End Date Taking? Authorizing Provider  albuterol (VENTOLIN HFA) 108 (90 Base) MCG/ACT inhaler Inhale 1-2 puffs into the lungs every 6 (six) hours as needed for wheezing or shortness of breath. 09/23/19  Yes Norval Gable, MD  cholecalciferol (VITAMIN D) 1000 units tablet Take 4,000 Units daily by mouth.    Yes [provider]  fluticasone (FLONASE) 50 MCG/ACT nasal spray Place 2 sprays into both nostrils daily. 12/05/15  Yes Frederich Cha, MD  gabapentin (NEURONTIN) 300 MG capsule Take 300 mg by mouth 3 (three) times daily.   Yes [provider]  hydrOXYzine (ATARAX/VISTARIL) 25 MG tablet Take 1 tablet (25 mg total) by mouth every 6 (six) hours as needed for up to 7 days. 03/27/21 04/03/21 Yes Danton Clap, PA-C  meloxicam (MOBIC) 15 MG tablet Take 1 tablet by mouth daily. 11/01/20  Yes [provider]  pantoprazole (PROTONIX) 40 MG tablet Take 40 mg by mouth daily.   Yes [provider]  triamcinolone ointment (KENALOG) 0.5 % Apply 1 application topically 2 (two) times daily for 7 days. 03/27/21 04/03/21 Yes Danton Clap, PA-C  vitamin B-12 (CYANOCOBALAMIN) 1000 MCG tablet Take 1,000 mcg by mouth daily.   Yes [provider]  Glatiramer Acetate 40 MG/ML SOSY Inject into the skin.    [provider]  HYDROcodone-acetaminophen (NORCO/VICODIN) 5-325 MG tablet 1-2 tabs po qd prn 08/22/18   Norval Gable, MD  interferon beta-1a (AVONEX) 30 MCG injection Inject into the muscle. 05/31/19 05/30/20  [provider]  Loratadine 10 MG CAPS Take by mouth.    [provider]  magnesium 30 MG tablet Take 30 mg by mouth 2 (two) times daily.    [provider]  Ocrelizumab (OCREVUS IV) Inject into the vein.    [provider]  penicillin v potassium (VEETID) 500 MG tablet Take 1 tablet (500 mg total) by mouth 3 (three) times daily. 08/22/18   Norval Gable, MD    Family History Family History  Problem Relation Age of Onset   Breast cancer Sister 61   Breast cancer Paternal 44    Breast cancer Cousin        pat cousin    Social History Social History   Tobacco Use   Smoking status: Former    Types: Cigarettes    Quit date: 1991    Years since quitting: 31.6   Smokeless tobacco: Never  Vaping Use   Vaping Use: Never used  Substance Use Topics   Alcohol use: No   Drug use: No     Allergies   Patient has no known allergies.   Review of Systems Review of Systems  Constitutional:  Positive for fatigue. Negative for fever.  HENT:  Negative for facial swelling.   Respiratory:  Negative for shortness of breath.   Musculoskeletal:  Positive for back pain. Gait problem: chronic. Skin:  Positive for rash.  Neurological:  Negative for weakness.    Physical Exam Triage Vital Signs ED Triage Vitals  Enc Vitals Group     BP 03/27/21 1746 115/63     Pulse Rate 03/27/21 1746 72     Resp 03/27/21 1746 18     Temp 03/27/21 1746 98.7 F (37.1 C)     Temp Source 03/27/21 1746 Oral     SpO2 03/27/21 1746 99 %     Weight 03/27/21 1743 199 lb 15.3 oz (90.7 kg)     Height 03/27/21 1743 '5\' 5"'$  (1.651 m)     Head Circumference --      Peak Flow --      Pain Score 03/27/21 1742 0     Pain Loc --      Pain Edu? --      Excl. in [employer]? --    No data found.  Updated Vital Signs BP 115/63 (BP Location: Left Arm)   Pulse 72   Temp 98.7 F (37.1 C) (Oral)   Resp 18   Ht '5\' 5"'$  (1.651 m)   Wt 199 lb 15.3 oz (90.7 kg)   SpO2 99%   BMI 33.27 kg/m     Physical Exam Vitals and nursing note reviewed.  Constitutional:      General: She is not in acute distress.    Appearance: Normal appearance. She is not ill-appearing or toxic-appearing.  HENT:     Head: Normocephalic and atraumatic.  Eyes:     General: No scleral icterus.       Right eye: No discharge.        Left eye: No discharge.     Conjunctiva/sclera:  Conjunctivae normal.  Cardiovascular:     Rate and Rhythm: Normal rate and regular rhythm.     Heart sounds: Normal heart  sounds.  Pulmonary:     Effort: Pulmonary effort is normal. No respiratory distress.     Breath sounds: Normal breath sounds.  Musculoskeletal:     Cervical back: Neck supple.  Skin:    General: Skin is dry.     Findings: Rash (few scattered erythematous papules with excoriations scattered bilateral lower extremities, upper extremities and chest) present.  Neurological:     General: No focal deficit present.     Mental Status: She is alert. Mental status is at baseline.     Motor: No weakness.     Gait: Gait normal.  Psychiatric:        Mood and Affect: Mood normal.        Behavior: Behavior normal.        Thought Content: Thought content normal.     UC Treatments / Results  Labs (all labs ordered are listed, but only abnormal results are displayed) Labs Reviewed - No data to display  EKG   Radiology No results found.  Procedures Procedures (including critical care time)  Medications Ordered in UC Medications - No data to display  Initial Impression / Assessment and Plan / UC Course  I have reviewed the triage vital signs and the nursing notes.  Pertinent labs & imaging results that were available during my care of the patient were reviewed by me and considered in my medical decision making (see chart for details).  60 year old female presenting with multiple insect bites/stings of lower extremities, upper extremities and chest.  The bumps are itchy.  She does not know what bit or stung her.  Exam is consistent with insect bite/stings.  Treating at this time with triamcinolone ointment.  We discussed treating her with oral prednisone but she declined that at this time.  I have sent hydroxyzine as well.  Advised her to keep the area clean and dry and monitor closely for any infection or worsening of her condition.  ED precautions reviewed.   Final  Clinical Impressions(s) / UC Diagnoses   Final diagnoses:  Insect bite, unspecified site, initial encounter     Discharge Instructions      Your rash is consistent with insect bites/stings.  I have sent a topical corticosteroid ointment.  Try to scratch these areas.  I have also sent hydroxyzine to help with itching.  Make a little drowsy so careful how it affects you before you take it during the day.  Follow-up with your primary care provider if you are not improving over the next week or if symptoms are worsening.  If you cannot see your primary provider, please follow-up with our department.     ED Prescriptions     Medication Sig Dispense Auth. Provider   triamcinolone ointment (KENALOG) 0.5 % Apply 1 application topically 2 (two) times daily for 7 days. 30 g Laurene Footman B, PA-C   hydrOXYzine (ATARAX/VISTARIL) 25 MG tablet Take 1 tablet (25 mg total) by mouth every 6 (six) hours as needed for up to 7 days. 28 tablet Danton Clap, PA-C      PDMP not reviewed this encounter.   Danton Clap, PA-C 03/27/21 1818

## 2021-03-27 NOTE — Discharge Instructions (Addendum)
Your rash is consistent with insect bites/stings.  I have sent a topical corticosteroid ointment.  Try to scratch these areas.  I have also sent hydroxyzine to help with itching.  Make a little drowsy so careful how it affects you before you take it during the day.  Follow-up with your primary care provider if you are not improving over the next week or if symptoms are worsening.  If you cannot see your primary provider, please follow-up with our department.

## 2021-05-08 ENCOUNTER — Other Ambulatory Visit: Payer: Self-pay

## 2021-05-08 ENCOUNTER — Ambulatory Visit
Admission: EM | Admit: 2021-05-08 | Discharge: 2021-05-08 | Disposition: A | Discharge status: Home / Self Care | Payer: Medicare HMO | Attending: Physician Assistant | Admitting: Physician Assistant

## 2021-05-08 DIAGNOSIS — J069 Acute upper respiratory infection, unspecified: Secondary | ICD-10-CM

## 2021-05-08 DIAGNOSIS — R051 Acute cough: Secondary | ICD-10-CM | POA: Insufficient documentation

## 2021-05-08 DIAGNOSIS — Z87891 Personal history of nicotine dependence: Secondary | ICD-10-CM | POA: Insufficient documentation

## 2021-05-08 DIAGNOSIS — U071 COVID-19: Secondary | ICD-10-CM | POA: Diagnosis not present

## 2021-05-08 DIAGNOSIS — K120 Recurrent oral aphthae: Secondary | ICD-10-CM | POA: Diagnosis not present

## 2021-05-08 DIAGNOSIS — G35 Multiple sclerosis: Secondary | ICD-10-CM | POA: Diagnosis not present

## 2021-05-08 DIAGNOSIS — J029 Acute pharyngitis, unspecified: Secondary | ICD-10-CM | POA: Diagnosis not present

## 2021-05-08 DIAGNOSIS — R0981 Nasal congestion: Secondary | ICD-10-CM | POA: Diagnosis not present

## 2021-05-08 MED ORDER — PSEUDOEPH-BROMPHEN-DM 30-2-10 MG/5ML PO SYRP: 10.0000 mL | ORAL_SOLUTION | Freq: Four times a day (QID) | ORAL | 0 refills | Status: AC | PRN

## 2021-05-08 MED ORDER — IPRATROPIUM BROMIDE 0.06 % NA SOLN: 2.0000 | Freq: Four times a day (QID) | NASAL | 0 refills | Status: AC

## 2021-05-08 NOTE — ED Provider Notes (Signed)
[facility]    CSN: [id number] Arrival date & time: 05/08/21  1202      History   Chief Complaint Chief Complaint  Patient presents with   Nasal Congestion   Headache   Mouth Lesions    HPI Nancy Duran is a 60 y.o. female presenting for 4-day history of nasal congestion, sneezing, headaches, and cough.  Patient also reports mild sore throat.  She says her nasal mucus is greenish and yellow.  Patient denies any fevers.  Has been somewhat achy but does have history of MS.  No significant increase in fatigue.  Patient says she noticed a ulcer inside her lower lip.  Has been using Orajel for that which has helped and it seems to be improving.  Patient denies any sick contacts and no known exposure to COVID-19.  Says she has had COVID-19 twice in the past with the last time being 6 months ago.  Patient confident that symptoms are not consistent with COVID-19 but is open to being tested again.  Denies breathing difficulty, vomiting or diarrhea.  Has been taking Mucinex over-the-counter without much improvement in symptoms.  No other complaints.  HPI  Past Medical History:  Diagnosis Date   DDD (degenerative disc disease), lumbosacral 02/05/2018   Hyperlipidemia    MS (multiple sclerosis) (HCC)    symptoms since 1990, diagnosed 2008    There are no problems to display for this patient.   Past Surgical History:  Procedure Laterality Date   ABDOMINAL HYSTERECTOMY     BREAST EXCISIONAL BIOPSY Left 1990's   NEG   BREAST SURGERY     CESAREAN SECTION     COLONOSCOPY WITH PROPOFOL N/A 06/11/2018   Procedure: COLONOSCOPY WITH PROPOFOL;  Surgeon: Lollie Sails, MD;  Location: [facility] ENDOSCOPY;  Service: Endoscopy;  Laterality: N/A;   ESOPHAGOGASTRODUODENOSCOPY N/A 06/11/2018   Procedure: ESOPHAGOGASTRODUODENOSCOPY (EGD);  Surgeon: Lollie Sails, MD;  Location: [facility] ENDOSCOPY;  Service: Endoscopy;  Laterality: N/A;   GASTRIC BYPASS     TONSILLECTOMY      OB  History   No obstetric history on file.      Home Medications    Prior to Admission medications   Medication Sig Start Date End Date Taking? Authorizing Provider  albuterol (VENTOLIN HFA) 108 (90 Base) MCG/ACT inhaler Inhale 1-2 puffs into the lungs every 6 (six) hours as needed for wheezing or shortness of breath. 09/23/19  Yes Norval Gable, MD  brompheniramine-pseudoephedrine-DM 30-2-10 MG/5ML syrup Take 10 mLs by mouth 4 (four) times daily as needed for up to 7 days. 05/08/21 05/15/21 Yes Laurene Footman B, PA-C  cholecalciferol (VITAMIN D) 1000 units tablet Take 4,000 Units daily by mouth.    Yes [provider]  fluticasone (FLONASE) 50 MCG/ACT nasal spray Place 2 sprays into both nostrils daily. 12/05/15  Yes Frederich Cha, MD  gabapentin (NEURONTIN) 600 MG tablet Take 600 mg by mouth 3 (three) times daily. 04/24/21  Yes [provider]  Glatiramer Acetate 40 MG/ML SOSY Inject into the skin.   Yes [provider]  ipratropium (ATROVENT) 0.06 % nasal spray Place 2 sprays into both nostrils 4 (four) times daily. 05/08/21  Yes Laurene Footman B, PA-C  Loratadine 10 MG CAPS Take by mouth.   Yes [provider]  magnesium 30 MG tablet Take 30 mg by mouth 2 (two) times daily.   Yes [provider]  meloxicam (MOBIC) 15 MG tablet Take 1 tablet by mouth daily. 11/01/20  Yes [provider]  Ocrelizumab (OCREVUS IV) Inject into the vein.   Yes [provider]  pantoprazole (PROTONIX) 40 MG tablet Take 40 mg by mouth daily.   Yes [provider]  vitamin B-12 (CYANOCOBALAMIN) 1000 MCG tablet Take 1,000 mcg by mouth daily.   Yes [provider]  interferon beta-1a (AVONEX) 30 MCG injection Inject into the muscle. 05/31/19 05/30/20  [provider]    Family History Family History  Problem Relation Age of Onset   Breast cancer Sister 17   Breast cancer Paternal 73    Breast cancer Cousin        pat cousin     Social History Social History   Tobacco Use   Smoking status: Former    Types: Cigarettes    Quit date: 1991    Years since quitting: 31.7   Smokeless tobacco: Never  Vaping Use   Vaping Use: Never used  Substance Use Topics   Alcohol use: No   Drug use: No     Allergies   Patient has no known allergies.   Review of Systems Review of Systems  Constitutional:  Positive for fatigue. Negative for chills, diaphoresis and fever.  HENT:  Positive for congestion, mouth sores, rhinorrhea and sneezing. Negative for ear pain, sinus pressure, sinus pain and sore throat.   Respiratory:  Positive for cough. Negative for shortness of breath.   Cardiovascular:  Negative for chest pain.  Gastrointestinal:  Negative for abdominal pain, nausea and vomiting.  Musculoskeletal:  Positive for myalgias. Negative for arthralgias.  Skin:  Negative for rash.  Neurological:  Positive for headaches. Negative for weakness.  Hematological:  Negative for adenopathy.    Physical Exam Triage Vital Signs ED Triage Vitals [05/08/21 1244]  Enc Vitals Group     BP      Pulse      Resp      Temp      Temp src      SpO2      Weight 200 lb (90.7 kg)     Height 5\' 5"  (1.651 m)     Head Circumference      Peak Flow      Pain Score 6     Pain Loc      Pain Edu?      Excl. in [place]?    No data found.  Updated Vital Signs BP 99/69 (BP Location: Left Arm)   Pulse 67   Temp 98.8 F (37.1 C) (Oral)   Resp 18   Ht 5\' 5"  (1.651 m)   Wt 200 lb (90.7 kg)   SpO2 100%   BMI 33.28 kg/m      Physical Exam Vitals and nursing note reviewed.  Constitutional:      General: She is not in acute distress.    Appearance: Normal appearance. She is well-developed. She is not ill-appearing or toxic-appearing.  HENT:     Head: Normocephalic and atraumatic.     Nose: Congestion present.     Mouth/Throat:     Mouth: Mucous membranes are moist.     Pharynx: Oropharynx is clear.     Comments: 2 small  aphthous ulcers inside lower lip Eyes:     General: No scleral icterus.       Right eye: No discharge.        Left eye: No discharge.     Conjunctiva/sclera: Conjunctivae normal.  Cardiovascular:     Rate and Rhythm: Normal rate and regular rhythm.  Heart sounds: Normal heart sounds.  Pulmonary:     Effort: Pulmonary effort is normal. No respiratory distress.     Breath sounds: Normal breath sounds.  Musculoskeletal:     Cervical back: Neck supple.  Skin:    General: Skin is dry.  Neurological:     General: No focal deficit present.     Mental Status: She is alert. Mental status is at baseline.     Motor: No weakness.     Gait: Gait normal.  Psychiatric:        Mood and Affect: Mood normal.        Behavior: Behavior normal.        Thought Content: Thought content normal.     UC Treatments / Results  Labs (all labs ordered are listed, but only abnormal results are displayed) Labs Reviewed  SARS CORONAVIRUS 2 (TAT 6-24 HRS)    EKG   Radiology No results found.  Procedures Procedures (including critical care time)  Medications Ordered in UC Medications - No data to display  Initial Impression / Assessment and Plan / UC Course  I have reviewed the triage vital signs and the nursing notes.  Pertinent labs & imaging results that were available during my care of the patient were reviewed by me and considered in my medical decision making (see chart for details).   60 year old female presenting for cough, congestion and headaches over the past 4 days.  Patient says symptoms not getting better or worse.  Has taken Mucinex without much improvement in symptoms.  Patient does also take antihistamine occasionally for allergies.  Vital signs stable and patient overall well-appearing.  Nasal congestion on exam and light yellowish rhinorrhea.  Chest is clear to auscultation heart regular rate and rhythm.  PCR COVID test obtained.  Current CDC guidelines, isolation protocol  and ED precautions reviewed the patient.  Advised patient symptoms consistent with viral illness.  Advised increasing rest and fluids.  I have sent Bromfed-DM to pharmacy and given a coupon.  Also sent Atrovent nasal spray.  Advised patient upper respiratory infections may last 7 to 10 days and sometimes longer.  Advised following up if symptoms go beyond 10 days or for worsening symptoms such as fever, worsening cough or breathing difficulty.   Final Clinical Impressions(s) / UC Diagnoses   Final diagnoses:  Viral upper respiratory tract infection  Nasal congestion  Acute cough  Aphthous ulcer     Discharge Instructions      URI/COLD SYMPTOMS: Your exam today is consistent with a viral illness. Antibiotics are not indicated at this time. Use medications as directed, including cough syrup, nasal saline, and decongestants. Your symptoms should improve over the next few days and resolve within 7-10 days. Increase rest and fluids. F/u if symptoms worsen or predominate such as sore throat, ear pain, productive cough, shortness of breath, or if you develop high fevers or worsening fatigue over the next several days.    You have received COVID testing today either for positive exposure, concerning symptoms that could be related to COVID infection, screening purposes, or re-testing after confirmed positive.  Your test obtained today checks for active viral infection in the last 1-2 weeks. If your test is negative now, you can still test positive later. So, if you do develop symptoms you should either get re-tested and/or isolate x 5 days and then strict mask use x 5 days (unvaccinated) or mask use x 10 days (vaccinated). Please follow CDC guidelines.  While Rapid antigen  tests come back in 15-20 minutes, send out PCR/molecular test results typically come back within 1-3 days. In the mean time, if you are symptomatic, assume this could be a positive test and treat/monitor yourself as if you do have  COVID.   We will call with test results if positive. Please download the MyChart app and set up a profile to access test results.   If symptomatic, go home and rest. Push fluids. Take Tylenol as needed for discomfort. Gargle warm salt water. Throat lozenges. Take Mucinex DM or Robitussin for cough. Humidifier in bedroom to ease coughing. Warm showers. Also review the COVID handout for more information.  COVID-19 INFECTION: The incubation period of COVID-19 is approximately 14 days after exposure, with most symptoms developing in roughly 4-5 days. Symptoms may range in severity from mild to critically severe. Roughly 80% of those infected will have mild symptoms. People of any age may become infected with COVID-19 and have the ability to transmit the virus. The most common symptoms include: fever, fatigue, cough, body aches, headaches, sore throat, nasal congestion, shortness of breath, nausea, vomiting, diarrhea, changes in smell and/or taste.    COURSE OF ILLNESS Some patients may begin with mild disease which can progress quickly into critical symptoms. If your symptoms are worsening please call ahead to the Emergency Department and proceed there for further treatment. Recovery time appears to be roughly 1-2 weeks for mild symptoms and 3-6 weeks for severe disease.   GO IMMEDIATELY TO ER FOR FEVER YOU ARE UNABLE TO GET DOWN WITH TYLENOL, BREATHING PROBLEMS, CHEST PAIN, FATIGUE, LETHARGY, INABILITY TO EAT OR DRINK, ETC  QUARANTINE AND ISOLATION: To help decrease the spread of COVID-19 please remain isolated if you have COVID infection or are highly suspected to have COVID infection. This means -stay home and isolate to one room in the home if you live with others. Do not share a bed or bathroom with others while ill, sanitize and wipe down all countertops and keep common areas clean and disinfected. Stay home for 5 days. If you have no symptoms or your symptoms are resolving after 5 days, you can leave  your house. Continue to wear a mask around others for 5 additional days. If you have been in close contact (within 6 feet) of someone diagnosed with COVID 19, you are advised to quarantine in your home for 14 days as symptoms can develop anywhere from 2-14 days after exposure to the virus. If you develop symptoms, you  must isolate.  Most current guidelines for COVID after exposure -unvaccinated: isolate 5 days and strict mask use x 5 days. Test on day 5 is possible -vaccinated: wear mask x 10 days if symptoms do not develop -You do not necessarily need to be tested for COVID if you have + exposure and  develop symptoms. Just isolate at home x10 days from symptom onset During this global pandemic, CDC advises to practice social distancing, try to stay at least 62ft away from others at all times. Wear a face covering. Wash and sanitize your hands regularly and avoid going anywhere that is not necessary.  KEEP IN MIND THAT THE COVID TEST IS NOT 100% ACCURATE AND YOU SHOULD STILL DO EVERYTHING TO PREVENT POTENTIAL SPREAD OF VIRUS TO OTHERS (WEAR MASK, WEAR GLOVES, [place] HANDS AND SANITIZE REGULARLY). IF INITIAL TEST IS NEGATIVE, THIS MAY NOT MEAN YOU ARE DEFINITELY NEGATIVE. MOST ACCURATE TESTING IS DONE 5-7 DAYS AFTER EXPOSURE.   It is not advised by CDC to get  re-tested after receiving a positive COVID test since you can still test positive for weeks to months after you have already cleared the virus.   *If you have not been vaccinated for COVID, I strongly suggest you consider getting vaccinated as long as there are no contraindications.       ED Prescriptions     Medication Sig Dispense Auth. Provider   brompheniramine-pseudoephedrine-DM 30-2-10 MG/5ML syrup Take 10 mLs by mouth 4 (four) times daily as needed for up to 7 days. 118 mL Laurene Footman B, PA-C   ipratropium (ATROVENT) 0.06 % nasal spray Place 2 sprays into both nostrils 4 (four) times daily. 15 mL Danton Clap, PA-C      PDMP  not reviewed this encounter.   Danton Clap, PA-C 05/08/21 1339

## 2021-05-08 NOTE — Discharge Instructions (Signed)

## 2021-05-08 NOTE — ED Triage Notes (Signed)
Pt c/o nasal congestion, sneezing, occasional cough for several days. Pt also reports headache and green nasal mucus. Pt has been taking Mucinex with no relief. Pt denies f/n/v/d or other symptoms. Pt also has a possible ulcer in her mouth.

## 2021-05-09 ENCOUNTER — Telehealth: Payer: Self-pay | Admitting: Physician Assistant

## 2021-05-09 DIAGNOSIS — U071 COVID-19: Secondary | ICD-10-CM

## 2021-05-09 LAB — SARS CORONAVIRUS 2 (TAT 6-24 HRS): SARS Coronavirus 2: POSITIVE — AB

## 2021-05-09 MED ORDER — MOLNUPIRAVIR 200 MG PO CAPS: 4.0000 | ORAL_CAPSULE | Freq: Two times a day (BID) | ORAL | 0 refills | Status: AC

## 2021-05-09 NOTE — Telephone Encounter (Signed)
Called and gave patient her positive COVID test result after verifying birthday.  I have sent in [facility] for her.  This would be her third COVID infection.  Advised her to continue with the cough medication and nasal decongestants.  Reviewed current CDC guidelines, isolation protocol and ED precautions with her.

## 2021-07-22 ENCOUNTER — Telehealth: Payer: Medicare HMO

## 2021-07-22 ENCOUNTER — Ambulatory Visit
Admission: RE | Admit: 2021-07-22 | Discharge: 2021-07-22 | Disposition: A | Discharge status: Home / Self Care | Payer: Medicare HMO | Source: Ambulatory Visit | Attending: Emergency Medicine | Admitting: Emergency Medicine

## 2021-07-22 ENCOUNTER — Other Ambulatory Visit: Payer: Self-pay

## 2021-07-22 VITALS — BP 139/76 | HR 74 | Temp 98.5°F | Resp 18 | Wt 223.5 lb

## 2021-07-22 DIAGNOSIS — H109 Unspecified conjunctivitis: Secondary | ICD-10-CM

## 2021-07-22 DIAGNOSIS — B9689 Other specified bacterial agents as the cause of diseases classified elsewhere: Secondary | ICD-10-CM | POA: Diagnosis not present

## 2021-07-22 DIAGNOSIS — J0110 Acute frontal sinusitis, unspecified: Secondary | ICD-10-CM | POA: Diagnosis not present

## 2021-07-22 MED ORDER — POLYMYXIN B-TRIMETHOPRIM 10000-0.1 UNIT/ML-% OP SOLN: 1.0000 [drp] | OPHTHALMIC | 0 refills | Status: AC

## 2021-07-22 MED ORDER — AMOXICILLIN-POT CLAVULANATE 875-125 MG PO TABS: 1.0000 | ORAL_TABLET | Freq: Two times a day (BID) | ORAL | 0 refills | Status: DC

## 2021-07-22 NOTE — ED Triage Notes (Signed)
Appt. Patient is here today for "Cough & Congestion". Started on Saturday after being out in the cold on Saturday for about an hour. History of MS. No fever. No sob. No COVID19 testing at home or elsewhere. COVID19 vaccines (none). Flu vaccine (none).

## 2021-07-22 NOTE — Discharge Instructions (Addendum)
Today you being treated for bacterial conjunctivitis.   Place one drop of polytrim into the effected eye every 4 hours while awake for 7 days. If the other eye starts to have symptoms you may use medication in it as well. Do not allow tip of dropper to touch eye. May use cool compress for comfort and to remove discharge if present. Pat the eye, do not wipe.  Do not rub eyes, this may cause more irritation.  May use benadryl as needed to help if itching present.  Please avoid use of eye makeup until symptoms clear.  If symptoms persist after use of medication, please follow up at Urgent Care or with ophthalmologist (eye doctor)   Take Augmentin twice a day for 7 days.   Perform sinus irrigation 2-3 times a day with a [employer] sinus rinse kit and distilled water.  Do not use tap water.  You can use plain over-the-counter Mucinex 60 mL every 12 hours to break up the stickiness of the mucus so your body can clear it.  Increase your oral fluid intake to thin out your mucus so that is also able for your body to clear more easily.  Use the Atrovent nasal spray, 2 squirts up each nostril every 6 hours, as needed for nasal and sinus congestion.  If you develop any new or worsening symptoms return for reevaluation or see your primary care provider.

## 2021-07-22 NOTE — ED Provider Notes (Signed)
[facility]    CSN: [id number] Arrival date & time: 07/22/21  1431      History   Chief Complaint Chief Complaint  Patient presents with   Cough   Nasal Congestion    HPI Nancy Duran is a 60 y.o. female.    Patient presents with nasal congestion , nosebleeds, rhinorrhea, and productive cough with green sputum for 9 days.  Endorses that eye irritation, redness and discharge began in the last 2 to 3 days.has attempted use of mucinex, which was helpful. Cough and sore throat predominately during nighttime. History of hyperlipidemia, MS. Tolerating food and liquids but decreased appetite.  Endorses that symptoms began having standing outside during the nighttime. Unvaccinated.   Past Medical History:  Diagnosis Date   DDD (degenerative disc disease), lumbosacral 02/05/2018   Hyperlipidemia    MS (multiple sclerosis) (HCC)    symptoms since 1990, diagnosed 2008    There are no problems to display for this patient.   Past Surgical History:  Procedure Laterality Date   ABDOMINAL HYSTERECTOMY     BREAST EXCISIONAL BIOPSY Left 1990's   NEG   BREAST SURGERY     CESAREAN SECTION     COLONOSCOPY WITH PROPOFOL N/A 06/11/2018   Procedure: COLONOSCOPY WITH PROPOFOL;  Surgeon: Lollie Sails, MD;  Location: [facility] ENDOSCOPY;  Service: Endoscopy;  Laterality: N/A;   ESOPHAGOGASTRODUODENOSCOPY N/A 06/11/2018   Procedure: ESOPHAGOGASTRODUODENOSCOPY (EGD);  Surgeon: Lollie Sails, MD;  Location: [facility] ENDOSCOPY;  Service: Endoscopy;  Laterality: N/A;   GASTRIC BYPASS     TONSILLECTOMY      OB History   No obstetric history on file.      Home Medications    Prior to Admission medications   Medication Sig Start Date End Date Taking? Authorizing Provider  albuterol (VENTOLIN HFA) 108 (90 Base) MCG/ACT inhaler Inhale 1-2 puffs into the lungs every 6 (six) hours as needed for wheezing or shortness of breath. Patient taking differently: Inhale 1-2 puffs  into the lungs every 6 (six) hours as needed for wheezing or shortness of breath. Not used recently. 09/23/19  Yes Norval Gable, MD  cholecalciferol (VITAMIN D) 1000 units tablet Take 4,000 Units daily by mouth.    Yes [provider]  fluticasone (FLONASE) 50 MCG/ACT nasal spray Place 2 sprays into both nostrils daily. 12/05/15  Yes Frederich Cha, MD  gabapentin (NEURONTIN) 600 MG tablet Take 600 mg by mouth 3 (three) times daily. 04/24/21  Yes [provider]  Glatiramer Acetate 40 MG/ML SOSY Inject into the skin.   Yes [provider]  ipratropium (ATROVENT) 0.06 % nasal spray Place 2 sprays into both nostrils 4 (four) times daily. 05/08/21  Yes Laurene Footman B, PA-C  Loratadine 10 MG CAPS Take by mouth.   Yes [provider]  magnesium 30 MG tablet Take 30 mg by mouth 2 (two) times daily.   Yes [provider]  meloxicam (MOBIC) 15 MG tablet Take 1 tablet by mouth daily. 11/01/20  Yes [provider]  Ocrelizumab (OCREVUS IV) Inject into the vein.   Yes [provider]  pantoprazole (PROTONIX) 40 MG tablet Take 40 mg by mouth daily.   Yes [provider]  vitamin B-12 (CYANOCOBALAMIN) 1000 MCG tablet Take 1,000 mcg by mouth daily.   Yes [provider]  interferon beta-1a (AVONEX) 30 MCG injection Inject into the muscle. 05/31/19 05/30/20  [provider]    Family History Family History  Problem Relation Age of  Onset   Breast cancer Sister 32   Breast cancer Paternal 78    Breast cancer Cousin        pat cousin    Social History Social History   Tobacco Use   Smoking status: Former    Types: Cigarettes    Quit date: 1991    Years since quitting: 31.9   Smokeless tobacco: Never  Vaping Use   Vaping Use: Never used  Substance Use Topics   Alcohol use: No   Drug use: No     Allergies   Patient has no known allergies.   Review of Systems Review of Systems  Constitutional: Negative.    HENT:  Positive for congestion, nosebleeds and sinus pressure. Negative for dental problem, drooling, ear discharge, ear pain, facial swelling, hearing loss, mouth sores, postnasal drip, rhinorrhea, sinus pain, sneezing, sore throat, tinnitus, trouble swallowing and voice change.   Eyes:  Positive for discharge and redness. Negative for photophobia, pain, itching and visual disturbance.  Respiratory:  Positive for cough. Negative for apnea, choking, chest tightness, shortness of breath, wheezing and stridor.   Cardiovascular: Negative.   Gastrointestinal: Negative.   Skin: Negative.   Neurological: Negative.     Physical Exam Triage Vital Signs ED Triage Vitals  Enc Vitals Group     BP 07/22/21 1500 139/76     Pulse Rate 07/22/21 1500 74     Resp 07/22/21 1500 18     Temp 07/22/21 1500 98.5 F (36.9 C)     Temp Source 07/22/21 1500 Oral     SpO2 07/22/21 1500 98 %     Weight 07/22/21 1459 223 lb 8.7 oz (101.4 kg)     Height --      Head Circumference --      Peak Flow --      Pain Score 07/22/21 1458 0     Pain Loc --      Pain Edu? --      Excl. in [place]? --    No data found.  Updated Vital Signs BP 139/76 (BP Location: Right Arm)    Pulse 74    Temp 98.5 F (36.9 C) (Oral)    Resp 18    Wt 223 lb 8.7 oz (101.4 kg)    SpO2 98%    BMI 37.20 kg/m   Visual Acuity Right Eye Distance:   Left Eye Distance:   Bilateral Distance:    Right Eye Near:   Left Eye Near:    Bilateral Near:     Physical Exam Constitutional:      Appearance: Normal appearance. She is normal weight.  HENT:     Head: Normocephalic.     Right Ear: Tympanic membrane, ear canal and external ear normal.     Left Ear: Tympanic membrane, ear canal and external ear normal.     Nose: Congestion and rhinorrhea present.     Right Sinus: Maxillary sinus tenderness and frontal sinus tenderness present.     Left Sinus: Maxillary sinus tenderness and frontal sinus tenderness present.     Mouth/Throat:      Mouth: Mucous membranes are moist.     Pharynx: Posterior oropharyngeal erythema present.  Eyes:     Extraocular Movements: Extraocular movements intact.     Comments: Bilateral eye redness in the conjunctiva, visual acuity intact, extraocular movements intact, no drainage noted, no swelling noted  Cardiovascular:     Rate and Rhythm: Normal rate and regular rhythm.  Pulses: Normal pulses.     Heart sounds: Normal heart sounds.  Pulmonary:     Effort: Pulmonary effort is normal.     Breath sounds: Normal breath sounds.  Musculoskeletal:     Cervical back: Normal range of motion and neck supple.  Skin:    General: Skin is warm and dry.  Neurological:     Mental Status: She is alert and oriented to person, place, and time. Mental status is at baseline.  Psychiatric:        Mood and Affect: Mood normal.        Behavior: Behavior normal.     UC Treatments / Results  Labs (all labs ordered are listed, but only abnormal results are displayed) Labs Reviewed - No data to display  EKG   Radiology No results found.  Procedures Procedures (including critical care time)  Medications Ordered in UC Medications - No data to display  Initial Impression / Assessment and Plan / UC Course  I have reviewed the triage vital signs and the nursing notes.  Pertinent labs & imaging results that were available during my care of the patient were reviewed by me and considered in my medical decision making (see chart for details).  Acute nonrecurrent frontal sinusitis Bacterial conjunctivitis of both eyes  1.  Augmentin 875-125 mg twice daily for 7 days 2.  Recommended over-the-counter nasal spray, continue use of Mucinex increasing dose from 400mg  to 1200 mg twice a day, saline irrigation and antihistamines as needed 3.  Polytrim 1 drop each eye every 4 hours for the next 7 days, recommended use of cool compresses, over-the-counter oral antihistamines, discontinuation of make-up until  symptoms resolved 4.  May follow-up with urgent care for persistent or reoccurring symptoms Final Clinical Impressions(s) / UC Diagnoses   Final diagnoses:  None   Discharge Instructions   None    ED Prescriptions   None    PDMP not reviewed this encounter.   Hans Eden, NP 07/22/21 1558

## 2021-10-04 ENCOUNTER — Other Ambulatory Visit: Payer: Self-pay | Admitting: Physician Assistant

## 2021-10-04 DIAGNOSIS — Z1231 Encounter for screening mammogram for malignant neoplasm of breast: Secondary | ICD-10-CM

## 2021-10-23 ENCOUNTER — Ambulatory Visit: Payer: Medicare HMO

## 2021-12-19 ENCOUNTER — Ambulatory Visit (INDEPENDENT_AMBULATORY_CARE_PROVIDER_SITE_OTHER)
Admission: RE | Admit: 2021-12-19 | Discharge: 2021-12-19 | Disposition: A | Discharge status: Home / Self Care | Payer: Medicare HMO | Source: Ambulatory Visit | Attending: Internal Medicine | Admitting: Internal Medicine

## 2021-12-19 ENCOUNTER — Ambulatory Visit
Admission: RE | Admit: 2021-12-19 | Discharge: 2021-12-19 | Disposition: A | Discharge status: Home / Self Care | Payer: Medicare HMO | Source: Ambulatory Visit | Attending: Physician Assistant | Admitting: Physician Assistant

## 2021-12-19 VITALS — BP 120/76 | HR 63 | Temp 97.6°F | Resp 18 | Ht 66.0 in | Wt 220.0 lb

## 2021-12-19 DIAGNOSIS — M79604 Pain in right leg: Secondary | ICD-10-CM

## 2021-12-19 DIAGNOSIS — G35 Multiple sclerosis: Secondary | ICD-10-CM | POA: Diagnosis not present

## 2021-12-19 DIAGNOSIS — M5441 Lumbago with sciatica, right side: Secondary | ICD-10-CM

## 2021-12-19 MED ORDER — MELOXICAM 15 MG PO TABS: 15.0000 mg | ORAL_TABLET | Freq: Every day | ORAL | 0 refills | Status: AC | PRN

## 2021-12-19 MED ORDER — BACLOFEN 10 MG PO TABS: 10.0000 mg | ORAL_TABLET | Freq: Three times a day (TID) | ORAL | 0 refills | Status: AC | PRN

## 2021-12-19 NOTE — ED Triage Notes (Signed)
Pt c/o right leg pain since May 2nd.  Pt states that her leg is always hurting but has hurt worse since travelling in May. Pt states that she did a lot of walking in [place].  Pt states that while returning home she had a "dark ring" around her ankle that has went away.  Pt states that she feels a "pull" on the back of her leg from knee to buttock.   Pt has MS and was told to go to the urgent care yesterday for a possible blood clot in the right leg.   Pt has new numbness from her knee down and mostly in the calf.   Pt went out yesterday and felt bad and had SOB and chest tightness. Pt states that it has gone away.

## 2021-12-19 NOTE — ED Provider Notes (Signed)
[facility]    CSN: [id number] Arrival date & time: 12/19/21  1120      History   Chief Complaint Chief Complaint  Patient presents with   Leg Pain    HPI Cabria Micalizzi is a 61 y.o. female presenting for right leg pain for the past couple of weeks.  Patient does have history of MS and follows up with neurology. Patient receives Ocrevus infusion treatments. The last treatment was 2 months ago.  She says she receives them every 6 months.  Also takes gabapentin and duloxetine.  Presently patient is 89 she has lower back pain that is chronic but notices increased pain of the right lower back, pain into right hip, right posterior thigh and anterior right lower leg.  She also has numbness of the anterior right lower leg.  Denies falls or injury.  No leg weakness.  Has tried Tylenol for pain without any improvement.  Patient spoke with her neurologist to wanted her to be evaluated for possible DVT.  She reports no history of DVT.  She is supposed to have upcoming MRIs to see if there is any progression of her MS disease.  Patient reports that she very infrequently has flareups.  She does have a follow-up appoint with her doctor in 5 days.  No other complaints.  HPI  Past Medical History:  Diagnosis Date   DDD (degenerative disc disease), lumbosacral 02/05/2018   Hyperlipidemia    MS (multiple sclerosis) (HCC)    symptoms since 1990, diagnosed 2008    There are no problems to display for this patient.   Past Surgical History:  Procedure Laterality Date   ABDOMINAL HYSTERECTOMY     BREAST EXCISIONAL BIOPSY Left 1990's   NEG   BREAST SURGERY     CESAREAN SECTION     COLONOSCOPY WITH PROPOFOL N/A 06/11/2018   Procedure: COLONOSCOPY WITH PROPOFOL;  Surgeon: Lollie Sails, MD;  Location[facility] ENDOSCOPY;  Service: Endoscopy;  Laterality: N/A;   ESOPHAGOGASTRODUODENOSCOPY N/A 06/11/2018   Procedure: ESOPHAGOGASTRODUODENOSCOPY (EGD);  Surgeon: Lollie Sails, MD;   Location: [facility] ENDOSCOPY;  Service: Endoscopy;  Laterality: N/A;   GASTRIC BYPASS     TONSILLECTOMY      OB History   No obstetric history on file.      Home Medications    Prior to Admission medications   Medication Sig Start Date End Date Taking? Authorizing Provider  albuterol (VENTOLIN HFA) 108 (90 Base) MCG/ACT inhaler Inhale 1-2 puffs into the lungs every 6 (six) hours as needed for wheezing or shortness of breath. Patient taking differently: Inhale 1-2 puffs into the lungs every 6 (six) hours as needed for wheezing or shortness of breath. Not used recently. 09/23/19  Yes Norval Gable, MD  baclofen (LIORESAL) 10 MG tablet Take 1 tablet (10 mg total) by mouth 3 (three) times daily as needed for muscle spasms. 12/19/21  Yes Laurene Footman B, PA-C  cholecalciferol (VITAMIN D) 1000 units tablet Take 4,000 Units daily by mouth.    Yes [provider]  fluticasone (FLONASE) 50 MCG/ACT nasal spray Place 2 sprays into both nostrils daily. 12/05/15  Yes Frederich Cha, MD  gabapentin (NEURONTIN) 600 MG tablet Take 600 mg by mouth 3 (three) times daily. 04/24/21  Yes [provider]  ipratropium (ATROVENT) 0.06 % nasal spray Place 2 sprays into both nostrils 4 (four) times daily. 05/08/21  Yes Laurene Footman B, PA-C  Loratadine 10 MG CAPS Take by mouth.   Yes [provider]  magnesium 30 MG tablet Take 30 mg by mouth 2 (two) times daily.   Yes [provider]  meloxicam (MOBIC) 15 MG tablet Take 1 tablet (15 mg total) by mouth daily as needed for pain. 12/19/21 01/18/22 Yes Danton Clap, PA-C  Ocrelizumab (OCREVUS IV) Inject into the vein.   Yes [provider]  pantoprazole (PROTONIX) 40 MG tablet Take 40 mg by mouth daily.   Yes [provider]  vitamin B-12 (CYANOCOBALAMIN) 1000 MCG tablet Take 1,000 mcg by mouth daily.   Yes [provider]  Glatiramer Acetate 40 MG/ML SOSY Inject into the skin.    [provider]   interferon beta-1a (AVONEX) 30 MCG injection Inject into the muscle. 05/31/19 05/30/20  [provider]  trimethoprim-polymyxin b (POLYTRIM) ophthalmic solution Place 1 drop into both eyes every 4 (four) hours. 07/22/21   Hans Eden, NP    Family History Family History  Problem Relation Age of Onset   Breast cancer Sister 13   Breast cancer Paternal 32    Breast cancer Cousin        pat cousin    Social History Social History   Tobacco Use   Smoking status: Former    Types: Cigarettes    Quit date: 1991    Years since quitting: 32.3   Smokeless tobacco: Never  Vaping Use   Vaping Use: Never used  Substance Use Topics   Alcohol use: No   Drug use: No     Allergies   Patient has no known allergies.   Review of Systems Review of Systems  Constitutional:  Negative for fatigue and fever.  Respiratory:  Negative for shortness of breath.   Cardiovascular:  Positive for leg swelling. Negative for chest pain.  Musculoskeletal:  Positive for arthralgias and back pain. Negative for gait problem and joint swelling.  Skin:  Negative for color change, rash and wound.  Neurological:  Positive for numbness. Negative for weakness.    Physical Exam Triage Vital Signs ED Triage Vitals  Enc Vitals Group     BP 12/19/21 1146 120/76     Pulse Rate 12/19/21 1146 63     Resp 12/19/21 1146 18     Temp 12/19/21 1146 97.6 F (36.4 C)     Temp Source 12/19/21 1146 Oral     SpO2 12/19/21 1146 98 %     Weight 12/19/21 1140 220 lb (99.8 kg)     Height 12/19/21 1140 '5\' 6"'$  (1.676 m)     Head Circumference --      Peak Flow --      Pain Score 12/19/21 1140 7     Pain Loc --      Pain Edu? --      Excl. in [place]? --    No data found.  Updated Vital Signs BP 120/76 (BP Location: Left Arm)   Pulse 63   Temp 97.6 F (36.4 C) (Oral)   Resp 18   Ht '5\' 6"'$  (1.676 m)   Wt 220 lb (99.8 kg)   SpO2 98%   BMI 35.51 kg/m      Physical Exam Vitals and nursing note  reviewed.  Constitutional:      General: She is not in acute distress.    Appearance: Normal appearance. She is not ill-appearing or toxic-appearing.  HENT:     Head: Normocephalic and atraumatic.  Eyes:     General: No scleral icterus.  Right eye: No discharge.        Left eye: No discharge.     Conjunctiva/sclera: Conjunctivae normal.  Cardiovascular:     Rate and Rhythm: Normal rate and regular rhythm.     Heart sounds: Normal heart sounds.  Pulmonary:     Effort: Pulmonary effort is normal. No respiratory distress.     Breath sounds: Normal breath sounds.  Musculoskeletal:     Cervical back: Neck supple.     Comments: Right lower leg: Circumference of right calf is 15.25 inches and left calf measures 15 inches.  No erythema or increased warmth.  She does have calf tenderness.  Low back: Tenderness to palpation throughout the right paralumbar region, right gluteus.  Positive straight leg raise on the left right and negative on the left.  Full range of motion of back  Skin:    General: Skin is dry.  Neurological:     General: No focal deficit present.     Mental Status: She is alert. Mental status is at baseline.     Motor: No weakness.     Gait: Gait normal.     Comments: 5 out of 5 strength bilateral lower extremities  Psychiatric:        Mood and Affect: Mood normal.        Behavior: Behavior normal.        Thought Content: Thought content normal.     UC Treatments / Results  Labs (all labs ordered are listed, but only abnormal results are displayed) Labs Reviewed - No data to display  EKG   Radiology US Venous Img Lower Unilateral Right  Result Date: 12/19/2021 CLINICAL DATA:  Right lower leg pain, numbness and swelling EXAM: RIGHT LOWER EXTREMITY VENOUS DOPPLER ULTRASOUND TECHNIQUE: Gray-scale sonography with compression, as well as color and duplex ultrasound, were performed to evaluate the deep venous system(s) from the level of the common femoral vein  through the popliteal and proximal calf veins. COMPARISON:  None Available. FINDINGS: VENOUS Normal compressibility of the common femoral, superficial femoral, and popliteal veins, as well as the visualized calf veins. Visualized portions of profunda femoral vein and great saphenous vein unremarkable. No filling defects to suggest DVT on grayscale or color Doppler imaging. Doppler waveforms show normal direction of venous flow, normal respiratory plasticity and response to augmentation. Limited views of the contralateral common femoral vein are unremarkable. OTHER None. Limitations: none IMPRESSION: Negative. Electronically Signed   By: Jacqulynn Cadet M.D.   On: 12/19/2021 12:41    Procedures Procedures (including critical care time)  Medications Ordered in UC Medications - No data to display  Initial Impression / Assessment and Plan / UC Course  I have reviewed the triage vital signs and the nursing notes.  Pertinent labs & imaging results that were available during my care of the patient were reviewed by me and considered in my medical decision making (see chart for details).  61 year old female with history of MS presents for right leg pain for the past couple weeks.  She also has right lower back pain and pain into her right buttocks, right posterior thigh and into her right calf along with numbness of the right anterior lower leg.  Ultrasound of right lower extremity performed given her symptoms and request.  Also patient does have quarter inch discrepancy in size of calves with the right calf being a little larger and she does have calf tenderness.  Ultrasound is negative for any abnormality.  Discussed this with her.  Suspect patient may have symptoms due to lumbar radiculopathy or flareup of MS.  She has upcoming appoint with her doctor in 5 days.  Advised to keep appointment.  At this time, I did offer prednisone but she says it makes her swell so she does not want to take it.  Sent  meloxicam to pharmacy.  She has taken this in the past.  Advise she may also take Tylenol.  Additionally I sent baclofen and encouraged her to continue exercising.  She says she is going to try water aerobics because she thinks it might be good on her joints.  Reviewed ED precautions with patient.   Final Clinical Impressions(s) / UC Diagnoses   Final diagnoses:  Right leg pain  Multiple sclerosis ([employer])  Acute bilateral low back pain with right-sided sciatica     Discharge Instructions      -Your ultrasound was negative for any clots. - Your leg pain could be related to a pinched nerve in your back or flareup of your MS. - We have sent anti-inflammatory medicine for you as well as a muscle relaxer.  Continue to exercise and follow-up with your neurologist as scheduled. - Go to ER for any significant worsening of your pain, numbness or if you have increased weakness or fall.     ED Prescriptions     Medication Sig Dispense Auth. Provider   meloxicam (MOBIC) 15 MG tablet Take 1 tablet (15 mg total) by mouth daily as needed for pain. 30 tablet Laurene Footman B, PA-C   baclofen (LIORESAL) 10 MG tablet Take 1 tablet (10 mg total) by mouth 3 (three) times daily as needed for muscle spasms. 30 each Gretta Cool      PDMP not reviewed this encounter.   Danton Clap, PA-C 12/19/21 1336

## 2021-12-19 NOTE — Discharge Instructions (Signed)
-  Your ultrasound was negative for any clots. - Your leg pain could be related to a pinched nerve in your back or flareup of your MS. - We have sent anti-inflammatory medicine for you as well as a muscle relaxer.  Continue to exercise and follow-up with your neurologist as scheduled. - Go to ER for any significant worsening of your pain, numbness or if you have increased weakness or fall.

## 2022-01-08 ENCOUNTER — Other Ambulatory Visit: Payer: Self-pay | Admitting: Gastroenterology

## 2022-01-08 DIAGNOSIS — R1013 Epigastric pain: Secondary | ICD-10-CM

## 2022-01-08 DIAGNOSIS — R1011 Right upper quadrant pain: Secondary | ICD-10-CM

## 2022-01-30 ENCOUNTER — Ambulatory Visit
Admission: RE | Admit: 2022-01-30 | Discharge: 2022-01-30 | Disposition: A | Discharge status: Home / Self Care | Payer: Medicare HMO | Source: Ambulatory Visit | Attending: Gastroenterology | Admitting: Gastroenterology

## 2022-01-30 DIAGNOSIS — R1013 Epigastric pain: Secondary | ICD-10-CM | POA: Diagnosis present

## 2022-01-30 DIAGNOSIS — R1011 Right upper quadrant pain: Secondary | ICD-10-CM

## 2022-01-30 MED ORDER — TECHNETIUM TC 99M MEBROFENIN IV KIT
5.0300 | PACK | Freq: Once | INTRAVENOUS | Status: AC | PRN
  Administered 2022-01-30: 5.03 via INTRAVENOUS

## 2022-02-12 ENCOUNTER — Ambulatory Visit
Admission: RE | Admit: 2022-02-12 | Discharge: 2022-02-12 | Disposition: A | Discharge status: Home / Self Care | Payer: Medicare HMO | Source: Ambulatory Visit | Attending: Physician Assistant | Admitting: Physician Assistant

## 2022-02-12 DIAGNOSIS — Z1231 Encounter for screening mammogram for malignant neoplasm of breast: Secondary | ICD-10-CM | POA: Insufficient documentation

## 2022-02-21 ENCOUNTER — Ambulatory Visit: Payer: Self-pay | Admitting: General Surgery

## 2022-02-21 DIAGNOSIS — K8020 Calculus of gallbladder without cholecystitis without obstruction: Secondary | ICD-10-CM

## 2022-02-21 NOTE — H&P (Signed)
PATIENT PROFILE: Nancy Duran is a 61 y.o. female who presents to the Clinic for consultation at the request of Dr. Jacqulyn Liner for evaluation of cholelithiasis and gallbladder dyskinesia.  PCP:  Sherre Scarlet, PA  HISTORY OF PRESENT ILLNESS: Ms. Nancy Duran reports she has been having epigastric pain since many years ago.  She endorses that the epigastric pain radiates to the right upper quadrant.  Pain exacerbated by every time that she eats.  There is been no alleviating factors.  Patient has had multiple upper endoscopies without any significant findings.  She also had a barium swallow without sign of gastric reflux.  Patient had an abdominal ultrasound that shows cholelithiasis without sign of cholecystitis.  She also had HIDA scan that shows low ejection fraction for the gallbladder.  I personally evaluated the images of the abdominal ultrasound and the HIDA scans.   PROBLEM LIST: Problem List  Date Reviewed: 09/27/2021          Noted   Vitamin D deficiency 09/01/2019   Whole body pain, unspecified 09/01/2019   Numbness and tingling 09/01/2019   MS (multiple sclerosis) (CMS-HCC) 03/19/2018   Chronic bilateral low back pain 03/19/2018   Hyperlipidemia, mixed 02/05/2018   Multiple sclerosis (CMS-HCC) Unknown   Overview    symptoms since 1990s, diagnosed 2008      DDD (degenerative disc disease), lumbar Unknown   History of anemia 03/03/2016   Bariatric surgery status 10/27/2012   Overview    Roux-en-Y in 2013 at [facility]:   General ROS: negative for - chills, fatigue, fever, weight gain or weight loss Allergy and Immunology ROS: negative for - hives  Hematological and Lymphatic ROS: negative for - bleeding problems or bruising, negative for palpable nodes Endocrine ROS: negative for - heat or cold intolerance, hair changes Respiratory ROS: negative for - cough, shortness of breath or wheezing Cardiovascular ROS: no chest pain or  palpitations GI ROS: negative for nausea, vomiting, abdominal pain, diarrhea, constipation Musculoskeletal ROS: Positive for - joint swelling and muscle pain Neurological ROS: negative for - confusion, syncope Dermatological ROS: negative for pruritus and rash Psychiatric: negative for anxiety, depression, difficulty sleeping and memory loss  MEDICATIONS: Current Outpatient Medications  Medication Sig Dispense Refill   albuterol 90 mcg/actuation inhaler Inhale into the lungs     CALCIUM CARBONATE/VITAMIN D3 (VITAMIN D-3 ORAL) Take by mouth.     cyanocobalamin (VITAMIN B12) 1000 MCG tablet Take 1,000 mcg by mouth once daily     diclofenac sodium 2 % SoPk Apply 40 mg topically 2 (two) times daily 2 g 6   DULoxetine (CYMBALTA) 30 MG DR capsule Take 1 capsule by mouth once daily 30 capsule 0   gabapentin (NEURONTIN) 600 MG tablet TAKE 1 TABLET BY MOUTH THREE TIMES DAILY 90 tablet 5   Herbal Supplement Herbal Name: Hair, skin and nails vitamin     ipratropium (ATROVENT) 0.06 % nasal spray Place 1 spray into both nostrils 2 (two) times daily 15 mL 11   loratadine (CLARITIN) 10 mg capsule Take 10 mg by mouth once daily     magnesium 200 mg Take by mouth     pantoprazole (PROTONIX) 40 MG DR tablet Take 1 tablet (40 mg total) by mouth once daily 30 tablet 11   terbinafine HCL (LAMISIL) 250 mg tablet Take 1 tablet (250 mg total) by mouth once daily for 90 days 30 tablet 2   zinc sulfate (ZINC-15 ORAL) Take  by mouth     No current facility-administered medications for this visit.    ALLERGIES: Patient has no known allergies.  PAST MEDICAL HISTORY: Past Medical History:  Diagnosis Date   Bariatric surgery status 10/27/2012   DDD (degenerative disc disease), lumbar    Hyperlipidemia, mixed 02/05/2018   Multiple sclerosis (CMS-HCC)    symptoms since 1990s, diagnosed 2008    PAST SURGICAL HISTORY: Past Surgical History:  Procedure Laterality Date   CESAREAN SECTION  1992   HYSTERECTOMY   2006   still has ovaries, for uterine fibroids   ROUX-EN-Y PROCEDURE  2013   COLONOSCOPY  06/11/2018   Tubular adenoma of the colon/Hyperplastic colon polyp/Repeat 1yr/MUS   EGD  06/11/2018   Reflux esophagitis/No Repeat/MUS   ASPIRATION CYST BREAST     fibroids removed     TONSILLECTOMY       FAMILY HISTORY: Family History  Problem Relation Age of Onset   Diabetes type II Mother    Diabetes type II Father    Depression Father    Coronary Artery Disease (Blocked arteries around heart) Father    High blood pressure (Hypertension) Father    Prostate cancer Father    Stroke Father    Lung cancer Father    Diabetes type II Sister    Diabetes type II Brother    Hyperlipidemia (Elevated cholesterol) Brother    High blood pressure (Hypertension) Brother    Liver cancer Brother    Coronary Artery Disease (Blocked arteries around heart) Maternal Grandmother    Diabetes type II Maternal Grandmother    Stroke Maternal Grandmother    Coronary Artery Disease (Blocked arteries around heart) Paternal Grandmother    Diabetes type II Paternal Grandmother    Stroke Paternal Grandmother    Breast cancer Sister    Lung cancer Sister    Breast cancer Paternal Aunt    Prostate cancer Brother      SOCIAL HISTORY: Social History   Socioeconomic History   Marital status: Widowed   Number of children: 2  Tobacco Use   Smoking status: Former    Packs/day: 1.00    Years: 13.00    Pack years: 13.00    Types: Cigarettes    Quit date: 1991    Years since quitting: 32.5   Smokeless tobacco: Never  Vaping Use   Vaping Use: Never used  Substance and Sexual Activity   Alcohol use: No   Drug use: No   Sexual activity: Not Currently  Social History Narrative   [place]   [place]    Former smoker 1/2 ppd x 15 yrs; quit 1991   Denies ETOH/drug use    Exercise: minimal      12/24/2021   Military  Service: None   Likes/Enjoys/What fills your day?:  Narrative   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 Last screening Date: March 2021. Screening is: In need.   Vision: Denies any issues with vision. Last screening Date: April 2023. Screening is: Up to date   Hearing: Denies any issues in Hearing  and some hearing loss with  watching tv.    Dermatology: Denies areas of concern.     PHYSICAL EXAM: Vitals:   02/21/22 0912  BP: (!) 146/85   Body mass index is 35.83 kg/m. Weight: 100.7 kg (222 lb)   GENERAL: Alert, active, oriented x3  HEENT: Pupils equal reactive to light. Extraocular movements are intact. Sclera clear. Palpebral conjunctiva normal red color.Pharynx clear.  NECK: Supple with no palpable mass and no adenopathy.  LUNGS: Sound clear with no rales rhonchi or wheezes.  HEART: Regular rhythm S1 and S2 without murmur.  ABDOMEN: Soft and depressible, nontender with no palpable mass, no hepatomegaly.   EXTREMITIES: Well-developed well-nourished symmetrical with no dependent edema.  NEUROLOGICAL: Awake alert oriented, facial expression symmetrical, moving all extremities.  REVIEW OF DATA: I have reviewed the following data today: Office Visit on 01/07/2022  Component Date Value   WBC (White Blood Cell Co* 01/07/2022 7.3    Hemoglobin 01/07/2022 12.7    Hematocrit 01/07/2022 38.2    Platelets 01/07/2022 292    MCV (Mean Corpuscular Vo* 01/07/2022 85    MCH (Mean Corpuscular He* 01/07/2022 28.3    MCHC (Mean Corpuscular H* 01/07/2022 33.2    RBC (Red Blood Cell Coun* 01/07/2022 4.48    RDW-CV (Red Cell Distrib* 01/07/2022 16.2 (H)    NRBC (Nucleated Red Bloo* 01/07/2022 0.00    NRBC % (Nucleated Red Bl* 01/07/2022 0.0     MPV (Mean Platelet Volum* 01/07/2022 12.5 (H)    Neutrophil Count 01/07/2022 5.1    Neutrophil % 01/07/2022 70.2    Lymphocyte Count 01/07/2022 1.3    Lymphocyte % 01/07/2022 17.4    Monocyte Count 01/07/2022 0.6    Monocyte % 01/07/2022 8.6    Eosinophil Count 01/07/2022 0.20    Eosinophil % 01/07/2022 2.7    Basophil Count 01/07/2022 0.06    Basophil % 01/07/2022 0.8    Immature Granulocyte Cou* 01/07/2022 0.02    Immature Granulocyte % 01/07/2022 0.3    WBC (White Blood Cell Co* 01/07/2022 7.3    Lymphocyte % 01/07/2022 17.4    Lymphocyte Count 01/07/2022 1.3    CD19 (Total B) % Lymphoc* 01/07/2022     CD19 (Total B) Absolute * 01/07/2022     Disclaimer 01/07/2022                     Value:This test was developed and its performance characteristics determined by the [facility] laboratory.  It has not been cleared or approved by the U.S. Food and Drug Administration.  The FDA has determined that such clearance or approval is not necessary.  This test is used for clinical purposes.  It should not be regarded as investigational or for research.  This laboratory is certified under the [place] (CLIA) as qualified to perform high complexity clinical testing.     ASSESSMENT: Ms. Nancy Duran is a 61 y.o. female presenting for consultation for cholelithiasis and biliary dyskinesia.    Patient was oriented about the diagnosis of cholelithiasis and biliary dyskinesia. Also oriented about what is the gallbladder, its anatomy and function and the implications of having stones / gallbladder low ejection fraction. The patient was oriented about the treatment alternatives (observation vs cholecystectomy). Patient was oriented that a low percentage of patient will continue to have similar pain symptoms even after the gallbladder is removed. Surgical technique (open vs laparoscopic) was discussed. It was also discussed the goals of the surgery  (decrease the pain episodes and avoid  the risk of cholecystitis) and the risk of surgery including: bleeding, infection, common bile duct injury, stone retention, injury to other organs such as bowel, liver, stomach, other complications such as hernia, bowel obstruction among others. Also discussed with patient about anesthesia and its complications such as: reaction to medications, pneumonia, heart complications, death, among others.   Patient requested to have autologous donation for possible transfusion as needed.  I started the process of how to coordinate this out of the examination.  Patient wanted surgery as soon as possible but without a lot of sedation I think that it will take few weeks to be able to coordinate.  Once I get this coordinated I will let the patient know to proceed with surgery.  Cholelithiasis without cholecystitis [K80.20]  PLAN: 1.  Robotic assisted laparoscopic cholecystectomy (77824) 2.  CBC, CMP 3.  Do not take aspirin 5 days before the procedure 5.  Contact us if has any question or concern.   Patient verbalized understanding, all questions were answered, and were agreeable with the plan outlined above.     Herbert Pun, MD  Electronically signed by Herbert Pun, MD

## 2022-02-21 NOTE — H&P (View-Only) (Signed)
PATIENT PROFILE: Nancy Duran is a 61 y.o. female who presents to the Clinic for consultation at the request of Dr. Jacqulyn Liner for evaluation of cholelithiasis and gallbladder dyskinesia.  PCP:  Sherre Scarlet, PA  HISTORY OF PRESENT ILLNESS: Ms. Nancy Duran reports she has been having epigastric pain since many years ago.  She endorses that the epigastric pain radiates to the right upper quadrant.  Pain exacerbated by every time that she eats.  There is been no alleviating factors.  Patient has had multiple upper endoscopies without any significant findings.  She also had a barium swallow without sign of gastric reflux.  Patient had an abdominal ultrasound that shows cholelithiasis without sign of cholecystitis.  She also had HIDA scan that shows low ejection fraction for the gallbladder.  I personally evaluated the images of the abdominal ultrasound and the HIDA scans.   PROBLEM LIST: Problem List  Date Reviewed: 09/27/2021          Noted   Vitamin D deficiency 09/01/2019   Whole body pain, unspecified 09/01/2019   Numbness and tingling 09/01/2019   MS (multiple sclerosis) (CMS-HCC) 03/19/2018   Chronic bilateral low back pain 03/19/2018   Hyperlipidemia, mixed 02/05/2018   Multiple sclerosis (CMS-HCC) Unknown   Overview    symptoms since 1990s, diagnosed 2008      DDD (degenerative disc disease), lumbar Unknown   History of anemia 03/03/2016   Bariatric surgery status 10/27/2012   Overview    Roux-en-Y in 2013 at [facility]:   General ROS: negative for - chills, fatigue, fever, weight gain or weight loss Allergy and Immunology ROS: negative for - hives  Hematological and Lymphatic ROS: negative for - bleeding problems or bruising, negative for palpable nodes Endocrine ROS: negative for - heat or cold intolerance, hair changes Respiratory ROS: negative for - cough, shortness of breath or wheezing Cardiovascular ROS: no chest pain or  palpitations GI ROS: negative for nausea, vomiting, abdominal pain, diarrhea, constipation Musculoskeletal ROS: Positive for - joint swelling and muscle pain Neurological ROS: negative for - confusion, syncope Dermatological ROS: negative for pruritus and rash Psychiatric: negative for anxiety, depression, difficulty sleeping and memory loss  MEDICATIONS: Current Outpatient Medications  Medication Sig Dispense Refill   albuterol 90 mcg/actuation inhaler Inhale into the lungs     CALCIUM CARBONATE/VITAMIN D3 (VITAMIN D-3 ORAL) Take by mouth.     cyanocobalamin (VITAMIN B12) 1000 MCG tablet Take 1,000 mcg by mouth once daily     diclofenac sodium 2 % SoPk Apply 40 mg topically 2 (two) times daily 2 g 6   DULoxetine (CYMBALTA) 30 MG DR capsule Take 1 capsule by mouth once daily 30 capsule 0   gabapentin (NEURONTIN) 600 MG tablet TAKE 1 TABLET BY MOUTH THREE TIMES DAILY 90 tablet 5   Herbal Supplement Herbal Name: Hair, skin and nails vitamin     ipratropium (ATROVENT) 0.06 % nasal spray Place 1 spray into both nostrils 2 (two) times daily 15 mL 11   loratadine (CLARITIN) 10 mg capsule Take 10 mg by mouth once daily     magnesium 200 mg Take by mouth     pantoprazole (PROTONIX) 40 MG DR tablet Take 1 tablet (40 mg total) by mouth once daily 30 tablet 11   terbinafine HCL (LAMISIL) 250 mg tablet Take 1 tablet (250 mg total) by mouth once daily for 90 days 30 tablet 2   zinc sulfate (ZINC-15 ORAL) Take  by mouth     No current facility-administered medications for this visit.    ALLERGIES: Patient has no known allergies.  PAST MEDICAL HISTORY: Past Medical History:  Diagnosis Date   Bariatric surgery status 10/27/2012   DDD (degenerative disc disease), lumbar    Hyperlipidemia, mixed 02/05/2018   Multiple sclerosis (CMS-HCC)    symptoms since 1990s, diagnosed 2008    PAST SURGICAL HISTORY: Past Surgical History:  Procedure Laterality Date   CESAREAN SECTION  1992   HYSTERECTOMY   2006   still has ovaries, for uterine fibroids   ROUX-EN-Y PROCEDURE  2013   COLONOSCOPY  06/11/2018   Tubular adenoma of the colon/Hyperplastic colon polyp/Repeat 62yr/MUS   EGD  06/11/2018   Reflux esophagitis/No Repeat/MUS   ASPIRATION CYST BREAST     fibroids removed     TONSILLECTOMY       FAMILY HISTORY: Family History  Problem Relation Age of Onset   Diabetes type II Mother    Diabetes type II Father    Depression Father    Coronary Artery Disease (Blocked arteries around heart) Father    High blood pressure (Hypertension) Father    Prostate cancer Father    Stroke Father    Lung cancer Father    Diabetes type II Sister    Diabetes type II Brother    Hyperlipidemia (Elevated cholesterol) Brother    High blood pressure (Hypertension) Brother    Liver cancer Brother    Coronary Artery Disease (Blocked arteries around heart) Maternal Grandmother    Diabetes type II Maternal Grandmother    Stroke Maternal Grandmother    Coronary Artery Disease (Blocked arteries around heart) Paternal Grandmother    Diabetes type II Paternal Grandmother    Stroke Paternal Grandmother    Breast cancer Sister    Lung cancer Sister    Breast cancer Paternal Aunt    Prostate cancer Brother      SOCIAL HISTORY: Social History   Socioeconomic History   Marital status: Widowed   Number of children: 2  Tobacco Use   Smoking status: Former    Packs/day: 1.00    Years: 13.00    Pack years: 13.00    Types: Cigarettes    Quit date: 1991    Years since quitting: 32.5   Smokeless tobacco: Never  Vaping Use   Vaping Use: Never used  Substance and Sexual Activity   Alcohol use: No   Drug use: No   Sexual activity: Not Currently  Social History Narrative   [place]   [place]    Former smoker 1/2 ppd x 15 yrs; quit 1991   Denies ETOH/drug use    Exercise: minimal      12/24/2021   Military  Service: None   Likes/Enjoys/What fills your day?:  Narrative   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 Last screening Date: March 2021. Screening is: In need.   Vision: Denies any issues with vision. Last screening Date: April 2023. Screening is: Up to date   Hearing: Denies any issues in Hearing  and some hearing loss with  watching tv.    Dermatology: Denies areas of concern.     PHYSICAL EXAM: Vitals:   02/21/22 0912  BP: (!) 146/85   Body mass index is 35.83 kg/m. Weight: 100.7 kg (222 lb)   GENERAL: Alert, active, oriented x3  HEENT: Pupils equal reactive to light. Extraocular movements are intact. Sclera clear. Palpebral conjunctiva normal red color.Pharynx clear.  NECK: Supple with no palpable mass and no adenopathy.  LUNGS: Sound clear with no rales rhonchi or wheezes.  HEART: Regular rhythm S1 and S2 without murmur.  ABDOMEN: Soft and depressible, nontender with no palpable mass, no hepatomegaly.   EXTREMITIES: Well-developed well-nourished symmetrical with no dependent edema.  NEUROLOGICAL: Awake alert oriented, facial expression symmetrical, moving all extremities.  REVIEW OF DATA: I have reviewed the following data today: Office Visit on 01/07/2022  Component Date Value   WBC (White Blood Cell Co* 01/07/2022 7.3    Hemoglobin 01/07/2022 12.7    Hematocrit 01/07/2022 38.2    Platelets 01/07/2022 292    MCV (Mean Corpuscular Vo* 01/07/2022 85    MCH (Mean Corpuscular He* 01/07/2022 28.3    MCHC (Mean Corpuscular H* 01/07/2022 33.2    RBC (Red Blood Cell Coun* 01/07/2022 4.48    RDW-CV (Red Cell Distrib* 01/07/2022 16.2 (H)    NRBC (Nucleated Red Bloo* 01/07/2022 0.00    NRBC % (Nucleated Red Bl* 01/07/2022 0.0     MPV (Mean Platelet Volum* 01/07/2022 12.5 (H)    Neutrophil Count 01/07/2022 5.1    Neutrophil % 01/07/2022 70.2    Lymphocyte Count 01/07/2022 1.3    Lymphocyte % 01/07/2022 17.4    Monocyte Count 01/07/2022 0.6    Monocyte % 01/07/2022 8.6    Eosinophil Count 01/07/2022 0.20    Eosinophil % 01/07/2022 2.7    Basophil Count 01/07/2022 0.06    Basophil % 01/07/2022 0.8    Immature Granulocyte Cou* 01/07/2022 0.02    Immature Granulocyte % 01/07/2022 0.3    WBC (White Blood Cell Co* 01/07/2022 7.3    Lymphocyte % 01/07/2022 17.4    Lymphocyte Count 01/07/2022 1.3    CD19 (Total B) % Lymphoc* 01/07/2022     CD19 (Total B) Absolute * 01/07/2022     Disclaimer 01/07/2022                     Value:This test was developed and its performance characteristics determined by the [facility] laboratory.  It has not been cleared or approved by the U.S. Food and Drug Administration.  The FDA has determined that such clearance or approval is not necessary.  This test is used for clinical purposes.  It should not be regarded as investigational or for research.  This laboratory is certified under the [place] (CLIA) as qualified to perform high complexity clinical testing.     ASSESSMENT: Ms. Nancy Duran is a 61 y.o. female presenting for consultation for cholelithiasis and biliary dyskinesia.    Patient was oriented about the diagnosis of cholelithiasis and biliary dyskinesia. Also oriented about what is the gallbladder, its anatomy and function and the implications of having stones / gallbladder low ejection fraction. The patient was oriented about the treatment alternatives (observation vs cholecystectomy). Patient was oriented that a low percentage of patient will continue to have similar pain symptoms even after the gallbladder is removed. Surgical technique (open vs laparoscopic) was discussed. It was also discussed the goals of the surgery  (decrease the pain episodes and avoid  the risk of cholecystitis) and the risk of surgery including: bleeding, infection, common bile duct injury, stone retention, injury to other organs such as bowel, liver, stomach, other complications such as hernia, bowel obstruction among others. Also discussed with patient about anesthesia and its complications such as: reaction to medications, pneumonia, heart complications, death, among others.   Patient requested to have autologous donation for possible transfusion as needed.  I started the process of how to coordinate this out of the examination.  Patient wanted surgery as soon as possible but without a lot of sedation I think that it will take few weeks to be able to coordinate.  Once I get this coordinated I will let the patient know to proceed with surgery.  Cholelithiasis without cholecystitis [K80.20]  PLAN: 1.  Robotic assisted laparoscopic cholecystectomy (07371) 2.  CBC, CMP 3.  Do not take aspirin 5 days before the procedure 5.  Contact us if has any question or concern.   Patient verbalized understanding, all questions were answered, and were agreeable with the plan outlined above.     Herbert Pun, MD  Electronically signed by Herbert Pun, MD

## 2022-02-24 ENCOUNTER — Ambulatory Visit: Payer: Self-pay | Admitting: General Surgery

## 2022-02-25 ENCOUNTER — Other Ambulatory Visit: Payer: Self-pay

## 2022-02-25 ENCOUNTER — Encounter
Admission: RE | Admit: 2022-02-25 | Discharge: 2022-02-25 | Disposition: A | Discharge status: Home / Self Care | Payer: Medicare HMO | Source: Ambulatory Visit | Attending: General Surgery | Admitting: General Surgery

## 2022-02-25 DIAGNOSIS — K8020 Calculus of gallbladder without cholecystitis without obstruction: Secondary | ICD-10-CM

## 2022-02-25 DIAGNOSIS — Z01812 Encounter for preprocedural laboratory examination: Secondary | ICD-10-CM | POA: Insufficient documentation

## 2022-02-25 HISTORY — DX: Other specified postprocedural states: Z98.890

## 2022-02-25 HISTORY — DX: Depression, unspecified: F32.A

## 2022-02-25 HISTORY — DX: Myoneural disorder, unspecified: G70.9

## 2022-02-25 HISTORY — DX: Anxiety disorder, unspecified: F41.9

## 2022-02-25 HISTORY — DX: Unspecified asthma, uncomplicated: J45.909

## 2022-02-25 HISTORY — DX: Anemia, unspecified: D64.9

## 2022-02-25 NOTE — Patient Instructions (Addendum)
Your procedure is scheduled on: 02/26/22 - Wednesday Report to the Registration Desk on the 1st floor of the [facility]. To find out your arrival time, please call [phone] between 1PM - 3PM on: 02/25/22 - Tuesday If your arrival time is 6:00 am, do not arrive prior to that time as the [facility] entrance doors do not open until 6:00 am.  REMEMBER: Instructions that are not followed completely may result in serious medical risk, up to and including death; or upon the discretion of your surgeon and anesthesiologist your surgery may need to be rescheduled.  Do not eat food or drink any fluids after midnight the night before surgery.  No gum chewing, lozengers or hard candies.  TAKE THESE MEDICATIONS THE MORNING OF SURGERY WITH A SIP OF WATER:  - pantoprazole (PROTONIX) 40 MG tablet, (take one the night before and one on the morning of surgery - helps to prevent nausea after surgery.)  Use albuterol (VENTOLIN  on the day of surgery and bring to the hospital.  One week prior to surgery: Stop Anti-inflammatories (NSAIDS) such as Advil, Aleve, Ibuprofen, Motrin, Naproxen, Naprosyn and Aspirin based products such as Excedrin, Goodys Powder, BC Powder.  Stop ANY OVER THE COUNTER supplements until after surgery.  You may take Tylenol if needed for pain up until the day of surgery.  No Alcohol for 24 hours before or after surgery.  No Smoking including e-cigarettes for 24 hours prior to surgery.  No chewable tobacco products for at least 6 hours prior to surgery.  No nicotine patches on the day of surgery.  Do not use any "recreational" drugs for at least a week prior to your surgery.  Please be advised that the combination of cocaine and anesthesia may have negative outcomes, up to and including death. If you test positive for cocaine, your surgery will be cancelled.  On the morning of surgery brush your teeth with toothpaste and water, you may rinse your mouth with mouthwash if  you wish. Do not swallow any toothpaste or mouthwash.  Use CHG Soap or wipes as directed on instruction sheet.  Do not wear jewelry, make-up, hairpins, clips or nail polish.  Do not wear lotions, powders, or perfumes.   Do not shave body from the neck down 48 hours prior to surgery just in case you cut yourself which could leave a site for infection.  Also, freshly shaved skin may become irritated if using the CHG soap.  Contact lenses, hearing aids and dentures may not be worn into surgery.  Do not bring valuables to the hospital. [facility] is not responsible for any missing/lost belongings or valuables.   Notify your doctor if there is any change in your medical condition (cold, fever, infection).  Wear comfortable clothing (specific to your surgery type) to the hospital.  After surgery, you can help prevent lung complications by doing breathing exercises.  Take deep breaths and cough every 1-2 hours. Your doctor may order a device called an Incentive Spirometer to help you take deep breaths. When coughing or sneezing, hold a pillow firmly against your incision with both hands. This is called "splinting." Doing this helps protect your incision. It also decreases belly discomfort.  If you are being admitted to the hospital overnight, leave your suitcase in the car. After surgery it may be brought to your room.  If you are being discharged the day of surgery, you will not be allowed to drive home. You will need a responsible adult (18  years or older) to drive you home and stay with you that night.   If you are taking public transportation, you will need to have a responsible adult (18 years or older) with you. Please confirm with your physician that it is acceptable to use public transportation.   Please call the [facility] at [phone] if you have any questions about these instructions.  Surgery Visitation Policy:  Patients undergoing a surgery or  procedure may have two family members or support persons with them as long as the person is not COVID-19 positive or experiencing its symptoms.   Inpatient Visitation:    Visiting hours are 7 a.m. to 8 p.m. Up to four visitors are allowed at one time in a patient room, including children. The visitors may rotate out with other people during the day. One designated support person (adult) may remain overnight.

## 2022-02-26 ENCOUNTER — Encounter: Payer: Self-pay | Admitting: General Surgery

## 2022-02-26 ENCOUNTER — Other Ambulatory Visit: Payer: Self-pay

## 2022-02-26 ENCOUNTER — Ambulatory Visit
Admission: RE | Admit: 2022-02-26 | Discharge: 2022-02-26 | Disposition: A | Discharge status: Home / Self Care | Payer: Medicare HMO | Source: Ambulatory Visit | Attending: General Surgery | Admitting: General Surgery

## 2022-02-26 ENCOUNTER — Encounter
Admission: RE | Disposition: A | Discharge status: Home / Self Care | Payer: Self-pay | Source: Ambulatory Visit | Attending: General Surgery

## 2022-02-26 ENCOUNTER — Ambulatory Visit: Payer: Medicare HMO | Admitting: Certified Registered"

## 2022-02-26 DIAGNOSIS — K8010 Calculus of gallbladder with chronic cholecystitis without obstruction: Secondary | ICD-10-CM | POA: Insufficient documentation

## 2022-02-26 DIAGNOSIS — Z87891 Personal history of nicotine dependence: Secondary | ICD-10-CM | POA: Insufficient documentation

## 2022-02-26 DIAGNOSIS — G35 Multiple sclerosis: Secondary | ICD-10-CM | POA: Insufficient documentation

## 2022-02-26 LAB — ABO/RH: ABO/RH(D): O POS

## 2022-02-26 SURGERY — CHOLECYSTECTOMY, ROBOT-ASSISTED, LAPAROSCOPIC
Anesthesia: General | Site: Abdomen

## 2022-02-26 MED ORDER — CEFAZOLIN SODIUM-DEXTROSE 2-4 GM/100ML-% IV SOLN
2.0000 g | INTRAVENOUS | Status: AC
  Administered 2022-02-26: 2 g via INTRAVENOUS

## 2022-02-26 MED ORDER — FENTANYL CITRATE (PF) 100 MCG/2ML IJ SOLN
INTRAMUSCULAR | Status: DC | PRN
  Administered 2022-02-26: 100 ug via INTRAVENOUS

## 2022-02-26 MED ORDER — ACETAMINOPHEN 10 MG/ML IV SOLN
INTRAVENOUS | Status: AC
  Filled 2022-02-26: qty 100

## 2022-02-26 MED ORDER — PROPOFOL 10 MG/ML IV BOLUS
INTRAVENOUS | Status: DC | PRN
  Administered 2022-02-26: 150 mg via INTRAVENOUS

## 2022-02-26 MED ORDER — OXYCODONE HCL 5 MG PO TABS
ORAL_TABLET | ORAL | Status: AC
  Filled 2022-02-26: qty 1

## 2022-02-26 MED ORDER — HYDROCODONE-ACETAMINOPHEN 5-325 MG PO TABS
1.0000 | ORAL_TABLET | ORAL | 0 refills | Status: AC | PRN
Start: 2022-02-26 — End: 2022-03-01

## 2022-02-26 MED ORDER — CEFAZOLIN SODIUM-DEXTROSE 2-4 GM/100ML-% IV SOLN
INTRAVENOUS | Status: AC
  Filled 2022-02-26: qty 100

## 2022-02-26 MED ORDER — MIDAZOLAM HCL 2 MG/2ML IJ SOLN
INTRAMUSCULAR | Status: AC
  Filled 2022-02-26: qty 2

## 2022-02-26 MED ORDER — DIPHENHYDRAMINE HCL 50 MG/ML IJ SOLN
INTRAMUSCULAR | Status: DC | PRN
  Administered 2022-02-26: 12.5 mg via INTRAVENOUS

## 2022-02-26 MED ORDER — FENTANYL CITRATE (PF) 100 MCG/2ML IJ SOLN
INTRAMUSCULAR | Status: AC
  Filled 2022-02-26: qty 2

## 2022-02-26 MED ORDER — PROPOFOL 1000 MG/100ML IV EMUL
INTRAVENOUS | Status: AC
  Filled 2022-02-26: qty 100

## 2022-02-26 MED ORDER — SUGAMMADEX SODIUM 200 MG/2ML IV SOLN
INTRAVENOUS | Status: DC | PRN
  Administered 2022-02-26: 200 mg via INTRAVENOUS

## 2022-02-26 MED ORDER — ONDANSETRON HCL 4 MG/2ML IJ SOLN
INTRAMUSCULAR | Status: AC
Start: 2022-02-26 — End: ?
  Filled 2022-02-26: qty 2

## 2022-02-26 MED ORDER — CHLORHEXIDINE GLUCONATE 0.12 % MT SOLN: 15.0000 mL | Freq: Once | OROMUCOSAL | Status: AC

## 2022-02-26 MED ORDER — DIPHENHYDRAMINE HCL 50 MG/ML IJ SOLN
INTRAMUSCULAR | Status: AC
Start: 2022-02-26 — End: ?
  Filled 2022-02-26: qty 1

## 2022-02-26 MED ORDER — BUPIVACAINE-EPINEPHRINE 0.25% -1:200000 IJ SOLN
INTRAMUSCULAR | Status: DC | PRN
  Administered 2022-02-26: 30 mL

## 2022-02-26 MED ORDER — DEXAMETHASONE SODIUM PHOSPHATE 10 MG/ML IJ SOLN
INTRAMUSCULAR | Status: DC | PRN
  Administered 2022-02-26: 5 mg via INTRAVENOUS

## 2022-02-26 MED ORDER — FENTANYL CITRATE (PF) 100 MCG/2ML IJ SOLN
25.0000 ug | INTRAMUSCULAR | Status: DC | PRN
  Administered 2022-02-26: 25 ug via INTRAVENOUS

## 2022-02-26 MED ORDER — LIDOCAINE HCL (PF) 2 % IJ SOLN
INTRAMUSCULAR | Status: AC
  Filled 2022-02-26: qty 5

## 2022-02-26 MED ORDER — BUPIVACAINE-EPINEPHRINE (PF) 0.25% -1:200000 IJ SOLN
INTRAMUSCULAR | Status: AC
  Filled 2022-02-26: qty 30

## 2022-02-26 MED ORDER — MIDAZOLAM HCL 2 MG/2ML IJ SOLN
INTRAMUSCULAR | Status: DC | PRN
  Administered 2022-02-26: 2 mg via INTRAVENOUS

## 2022-02-26 MED ORDER — OXYCODONE HCL 5 MG PO TABS
5.0000 mg | ORAL_TABLET | Freq: Once | ORAL | Status: AC
  Administered 2022-02-26: 5 mg via ORAL

## 2022-02-26 MED ORDER — ACETAMINOPHEN 10 MG/ML IV SOLN
INTRAVENOUS | Status: DC | PRN
  Administered 2022-02-26: 1000 mg via INTRAVENOUS

## 2022-02-26 MED ORDER — LIDOCAINE HCL (CARDIAC) PF 100 MG/5ML IV SOSY
PREFILLED_SYRINGE | INTRAVENOUS | Status: DC | PRN
  Administered 2022-02-26: 100 mg via INTRAVENOUS

## 2022-02-26 MED ORDER — PROPOFOL 10 MG/ML IV BOLUS
INTRAVENOUS | Status: AC
  Filled 2022-02-26: qty 20

## 2022-02-26 MED ORDER — INDOCYANINE GREEN 25 MG IV SOLR
1.2500 mg | Freq: Once | INTRAVENOUS | Status: AC
  Administered 2022-02-26: 1.25 mg via INTRAVENOUS
  Filled 2022-02-26: qty 0.5

## 2022-02-26 MED ORDER — FENTANYL CITRATE (PF) 100 MCG/2ML IJ SOLN
INTRAMUSCULAR | Status: AC
  Administered 2022-02-26: 25 ug via INTRAVENOUS
  Filled 2022-02-26: qty 2

## 2022-02-26 MED ORDER — PROPOFOL 500 MG/50ML IV EMUL
INTRAVENOUS | Status: DC | PRN
  Administered 2022-02-26: 150 ug/kg/min via INTRAVENOUS

## 2022-02-26 MED ORDER — ORAL CARE MOUTH RINSE: 15.0000 mL | Freq: Once | OROMUCOSAL | Status: AC

## 2022-02-26 MED ORDER — ONDANSETRON HCL 4 MG/2ML IJ SOLN
INTRAMUSCULAR | Status: DC | PRN
  Administered 2022-02-26: 4 mg via INTRAVENOUS

## 2022-02-26 MED ORDER — DEXAMETHASONE SODIUM PHOSPHATE 10 MG/ML IJ SOLN
INTRAMUSCULAR | Status: AC
  Filled 2022-02-26: qty 1

## 2022-02-26 MED ORDER — CHLORHEXIDINE GLUCONATE 0.12 % MT SOLN
OROMUCOSAL | Status: AC
  Administered 2022-02-26: 15 mL via OROMUCOSAL
  Filled 2022-02-26: qty 15

## 2022-02-26 MED ORDER — PHENYLEPHRINE 80 MCG/ML (10ML) SYRINGE FOR IV PUSH (FOR BLOOD PRESSURE SUPPORT)
PREFILLED_SYRINGE | INTRAVENOUS | Status: AC
  Filled 2022-02-26: qty 10

## 2022-02-26 MED ORDER — LACTATED RINGERS IV SOLN: INTRAVENOUS | Status: DC

## 2022-02-26 MED ORDER — PHENYLEPHRINE HCL (PRESSORS) 10 MG/ML IV SOLN
INTRAVENOUS | Status: DC | PRN
  Administered 2022-02-26: 160 ug via INTRAVENOUS
  Administered 2022-02-26 (×2): 80 ug via INTRAVENOUS

## 2022-02-26 MED ORDER — ROCURONIUM BROMIDE 100 MG/10ML IV SOLN
INTRAVENOUS | Status: DC | PRN
  Administered 2022-02-26: 50 mg via INTRAVENOUS

## 2022-02-26 MED ORDER — ROCURONIUM BROMIDE 10 MG/ML (PF) SYRINGE
PREFILLED_SYRINGE | INTRAVENOUS | Status: AC
  Filled 2022-02-26: qty 10

## 2022-02-26 SURGICAL SUPPLY — 54 items
BAG PRESSURE INF REUSE 1000 (BAG) IMPLANT
BLADE SURG SZ11 CARB STEEL (BLADE) ×2 IMPLANT
CANNULA REDUC XI 12-8 STAPL (CANNULA) ×1
CANNULA REDUCER 12-8 DVNC XI (CANNULA) ×1 IMPLANT
CATH REDDICK CHOLANGI 4FR 50CM (CATHETERS) IMPLANT
CLIP LIGATING HEM O LOK PURPLE (MISCELLANEOUS) IMPLANT
CLIP LIGATING HEMO O LOK GREEN (MISCELLANEOUS) ×2 IMPLANT
DERMABOND ADVANCED (GAUZE/BANDAGES/DRESSINGS) ×1
DERMABOND ADVANCED .7 DNX12 (GAUZE/BANDAGES/DRESSINGS) ×1 IMPLANT
DRAPE ARM DVNC X/XI (DISPOSABLE) ×4 IMPLANT
DRAPE C-ARM XRAY 36X54 (DRAPES) IMPLANT
DRAPE COLUMN DVNC XI (DISPOSABLE) ×1 IMPLANT
DRAPE DA VINCI XI ARM (DISPOSABLE) ×4
DRAPE DA VINCI XI COLUMN (DISPOSABLE) ×1
ELECT REM PT RETURN 9FT ADLT (ELECTROSURGICAL) ×2
ELECTRODE REM PT RTRN 9FT ADLT (ELECTROSURGICAL) ×1 IMPLANT
GLOVE BIO SURGEON STRL SZ 6.5 (GLOVE) ×6 IMPLANT
GLOVE BIOGEL PI IND STRL 6.5 (GLOVE) ×2 IMPLANT
GLOVE BIOGEL PI INDICATOR 6.5 (GLOVE) ×4
GOWN STRL REUS W/ TWL LRG LVL3 (GOWN DISPOSABLE) ×3 IMPLANT
GOWN STRL REUS W/TWL LRG LVL3 (GOWN DISPOSABLE) ×4
GRASPER SUT TROCAR 14GX15 (MISCELLANEOUS) ×2 IMPLANT
IRRIGATOR SUCT 8 DISP DVNC XI (IRRIGATION / IRRIGATOR) IMPLANT
IRRIGATOR SUCTION 8MM XI DISP (IRRIGATION / IRRIGATOR)
IV CATH ANGIO 12GX3 LT BLUE (NEEDLE) IMPLANT
IV NS 1000ML (IV SOLUTION)
IV NS 1000ML BAXH (IV SOLUTION) IMPLANT
KIT PINK PAD W/HEAD ARE REST (MISCELLANEOUS) ×2
KIT PINK PAD W/HEAD ARM REST (MISCELLANEOUS) ×1 IMPLANT
LABEL OR SOLS (LABEL) ×2 IMPLANT
MANIFOLD NEPTUNE II (INSTRUMENTS) ×2 IMPLANT
NDL INSUFFLATION 14GA 120MM (NEEDLE) ×1 IMPLANT
NEEDLE HYPO 22GX1.5 SAFETY (NEEDLE) ×2 IMPLANT
NEEDLE INSUFFLATION 14GA 120MM (NEEDLE) ×2 IMPLANT
NS IRRIG 500ML POUR BTL (IV SOLUTION) ×2 IMPLANT
OBTURATOR OPTICAL STANDARD 8MM (TROCAR) ×1
OBTURATOR OPTICAL STND 8 DVNC (TROCAR) ×1
OBTURATOR OPTICALSTD 8 DVNC (TROCAR) ×1 IMPLANT
PACK LAP CHOLECYSTECTOMY (MISCELLANEOUS) ×2 IMPLANT
SEAL CANN UNIV 5-8 DVNC XI (MISCELLANEOUS) ×3 IMPLANT
SEAL XI 5MM-8MM UNIVERSAL (MISCELLANEOUS) ×3
SET TUBE SMOKE EVAC HIGH FLOW (TUBING) ×2 IMPLANT
SOLUTION ELECTROLUBE (MISCELLANEOUS) ×2 IMPLANT
SPIKE FLUID TRANSFER (MISCELLANEOUS) ×4 IMPLANT
SPONGE T-LAP 4X18 ~~LOC~~+RFID (SPONGE) IMPLANT
STAPLER CANNULA SEAL DVNC XI (STAPLE) ×1 IMPLANT
STAPLER CANNULA SEAL XI (STAPLE) ×1
SUT MNCRL 4-0 (SUTURE) ×2
SUT MNCRL 4-0 27XMFL (SUTURE) ×2
SUT VICRYL 0 AB UR-6 (SUTURE) ×2 IMPLANT
SUTURE MNCRL 4-0 27XMF (SUTURE) ×1 IMPLANT
SYS BAG RETRIEVAL 10MM (BASKET) ×2
SYSTEM BAG RETRIEVAL 10MM (BASKET) ×1 IMPLANT
WATER STERILE IRR 500ML POUR (IV SOLUTION) ×1 IMPLANT

## 2022-02-26 NOTE — Transfer of Care (Signed)
Immediate Anesthesia Transfer of Care Note  Patient: Nancy Duran  Procedure(s) Performed: XI ROBOTIC ASSISTED LAPAROSCOPIC CHOLECYSTECTOMY (Abdomen)  Patient Location: PACU  Anesthesia Type:General  Level of Consciousness: drowsy  Airway & Oxygen Therapy: Patient connected to face mask oxygen  Post-op Assessment: Report given to RN  Post vital signs: stable  Last Vitals:  Vitals Value Taken Time  BP 126/64 02/26/22 1212  Temp    Pulse 76 02/26/22 1214  Resp 21 02/26/22 1214  SpO2 96 % 02/26/22 1214  Vitals shown include unvalidated device data.  Last Pain:  Vitals:   02/26/22 0823  TempSrc: Temporal  PainSc: 8          Complications: No notable events documented.

## 2022-02-26 NOTE — Op Note (Signed)
Preoperative diagnosis: Cholelithiasis  Postoperative diagnosis: Cholelithiasis  Procedure: Robotic Assisted Laparoscopic Cholecystectomy.   Anesthesia: GETA   Surgeon: Dr. Windell Moment  Wound Classification: Clean Contaminated  Indications: Patient is a 61 y.o. female developed right upper quadrant pain and on workup was found to have cholelithiasis with a normal common duct. Robotic Assisted Laparoscopic cholecystectomy was elected.  Findings:  Critical view of safety achieved Cystic duct and artery identified, ligated and divided Adequate hemostasis       Description of procedure: The patient was placed on the operating table in the supine position. General anesthesia was induced. A time-out was completed verifying correct patient, procedure, site, positioning, and implant(s) and/or special equipment prior to beginning this procedure. An orogastric tube was placed. The abdomen was prepped and draped in the usual sterile fashion.  An incision was made in a natural skin line below the umbilicus.  The fascia was elevated and the Veress needle inserted. Proper position was confirmed by aspiration and saline meniscus test.  The abdomen was insufflated with carbon dioxide to a pressure of 15 mmHg. The patient tolerated insufflation well. A 8-mm trocar was then inserted in optiview fashion.  The laparoscope was inserted and the abdomen inspected. No injuries from initial trocar placement were noted. Additional trocars were then inserted in the following locations: an 8-mm trocar in the left lateral abdomen, and another two 8-mm trocars to the right side of the abdomen 5 cm appart. The umbilical trocar was changed to a 12 mm trocar all under direct visualization. The abdomen was inspected and no abnormalities were found. The table was placed in the reverse Trendelenburg position with the right side up. The robotic arms were docked and target anatomy identified. Instrument inserted under  direct visualization.  Filmy adhesions between the gallbladder and omentum, duodenum and transverse colon were lysed with electrocautery. The dome of the gallbladder was grasped with a prograsp and retracted over the dome of the liver. The infundibulum was also grasped with an atraumatic grasper and retracted toward the right lower quadrant. This maneuver exposed Calot's triangle. The peritoneum overlying the gallbladder infundibulum was then incised and the cystic duct and cystic artery identified and circumferentially dissected. Critical view of safety reviewed before ligating any structure. Firefly images taken to visualize biliary ducts. The cystic duct and cystic artery were then doubly clipped and divided close to the gallbladder.  The gallbladder was then dissected from its peritoneal attachments by electrocautery. Hemostasis was checked and the gallbladder and contained stones were removed using an endoscopic retrieval bag. The gallbladder was passed off the table as a specimen. There was no evidence of bleeding from the gallbladder fossa or cystic artery or leakage of the bile from the cystic duct stump. Secondary trocars were removed under direct vision. No bleeding was noted. The robotic arms were undoked. The scope was withdrawn and the umbilical trocar removed. The abdomen was allowed to collapse. The fascia of the 78m trocar sites was closed with figure-of-eight 0 vicryl sutures. The skin was closed with subcuticular sutures of 4-0 monocryl and topical skin adhesive. The orogastric tube was removed.  The patient tolerated the procedure well and was taken to the postanesthesia care unit in stable condition.   Specimen: Gallbladder  Complications: None  EBL: 5 mL

## 2022-02-26 NOTE — Anesthesia Procedure Notes (Signed)
Procedure Name: Intubation Date/Time: 02/26/2022 11:05 AM  Performed by: Garner Nash, CRNAPre-anesthesia Checklist: Patient identified, Emergency Drugs available, Suction available and Patient being monitored Patient Re-evaluated:Patient Re-evaluated prior to induction Oxygen Delivery Method: Circle system utilized Preoxygenation: Pre-oxygenation with 100% oxygen Induction Type: IV induction Ventilation: Mask ventilation without difficulty and Oral airway inserted - appropriate to patient size Laryngoscope Size: Mac and 3 Tube type: Oral Tube size: 6.5 mm Number of attempts: 1 Airway Equipment and Method: Oral airway Placement Confirmation: ETT inserted through vocal cords under direct vision, positive ETCO2 and breath sounds checked- equal and bilateral Secured at: 21 cm Tube secured with: Tape Dental Injury: Teeth and Oropharynx as per pre-operative assessment

## 2022-02-26 NOTE — Discharge Instructions (Addendum)

## 2022-02-26 NOTE — Progress Notes (Signed)
Patient sleepy but aware of surroundings, procedure completed. Tolerated gingerale and crackers without event, Up in recliner, remains sleepy, did received 12.'5mg'$  benadryl IV in OR Denies nausea in pacu.

## 2022-02-26 NOTE — Anesthesia Preprocedure Evaluation (Signed)
Anesthesia Evaluation  Patient identified by MRN, date of birth, ID band Patient awake    Reviewed: Allergy & Precautions, H&P , NPO status , Patient's Chart, lab work & pertinent test results, reviewed documented beta blocker date and time   History of Anesthesia Complications (+) PONV and history of anesthetic complications  Airway Mallampati: II  TM Distance: >3 FB Neck ROM: full    Dental  (+) Chipped, Dental Advidsory Given, Missing   Pulmonary neg shortness of breath, asthma , neg sleep apnea, neg COPD, neg recent URI, former smoker,    Pulmonary exam normal breath sounds clear to auscultation       Cardiovascular Exercise Tolerance: Good Normal cardiovascular exam Rhythm:regular Rate:Normal     Neuro/Psych neg Seizures PSYCHIATRIC DISORDERS Anxiety Depression MS  Neuromuscular disease    GI/Hepatic negative GI ROS, Neg liver ROS,   Endo/Other  negative endocrine ROS  Renal/GU negative Renal ROS  negative genitourinary   Musculoskeletal  (+) Arthritis ,   Abdominal   Peds  Hematology negative hematology ROS (+)   Anesthesia Other Findings Past Medical History: No date: Anemia No date: Anxiety No date: Asthma 02/05/2018: DDD (degenerative disc disease), lumbosacral No date: Depression No date: Hyperlipidemia No date: MS (multiple sclerosis) ([place])     Comment:  symptoms since 1990, diagnosed 2008 No date: Neuromuscular disorder ([place])     Comment:  MS No date: PONV (postoperative nausea and vomiting)   Reproductive/Obstetrics negative OB ROS                             Anesthesia Physical  Anesthesia Plan  ASA: 3  Anesthesia Plan: General   Post-op Pain Management:    Induction: Intravenous  PONV Risk Score and Plan: 4 or greater and Ondansetron, Dexamethasone, Midazolam, Treatment may vary due to age or medical condition, Propofol infusion and TIVA  Airway  Management Planned: Oral ETT  Additional Equipment:   Intra-op Plan:   Post-operative Plan: Extubation in OR  Informed Consent: I have reviewed the patients History and Physical, chart, labs and discussed the procedure including the risks, benefits and alternatives for the proposed anesthesia with the patient or authorized representative who has indicated his/her understanding and acceptance.     Dental Advisory Given  Plan Discussed with: CRNA  Anesthesia Plan Comments:         Anesthesia Quick Evaluation

## 2022-02-26 NOTE — Interval H&P Note (Signed)
History and Physical Interval Note:  02/26/2022 10:31 AM  Nancy Duran  has presented today for surgery, with the diagnosis of K80.20 Cholelithiasis w/o cholecystitis.  The various methods of treatment have been discussed with the patient and family. After consideration of risks, benefits and other options for treatment, the patient has consented to  Procedure(s): XI ROBOTIC [place] (N/A) as a surgical intervention.  The patient's history has been reviewed, patient examined, no change in status, stable for surgery.  I have reviewed the patient's chart and labs.  Questions were answered to the patient's satisfaction.     Herbert Pun

## 2022-02-27 LAB — TYPE AND SCREEN
ABO/RH(D): O POS
Antibody Screen: POSITIVE
PT AG Type: POSITIVE
Unit division: 0
Unit division: 0

## 2022-02-27 LAB — BPAM RBC
Blood Product Expiration Date: 202308192359
Blood Product Expiration Date: 202308192359
Unit Type and Rh: 9500
Unit Type and Rh: 9500

## 2022-02-27 LAB — SURGICAL PATHOLOGY

## 2022-03-01 NOTE — Anesthesia Postprocedure Evaluation (Signed)
Anesthesia Post Note  Patient: Nancy Duran  Procedure(s) Performed: XI ROBOTIC ASSISTED LAPAROSCOPIC CHOLECYSTECTOMY (Abdomen)  Patient location during evaluation: PACU Anesthesia Type: General Level of consciousness: awake and alert Pain management: pain level controlled Vital Signs Assessment: post-procedure vital signs reviewed and stable Respiratory status: spontaneous breathing, nonlabored ventilation, respiratory function stable and patient connected to nasal cannula oxygen Cardiovascular status: blood pressure returned to baseline and stable Postop Assessment: no apparent nausea or vomiting Anesthetic complications: no   No notable events documented.   Last Vitals:  Vitals:   02/26/22 1320 02/26/22 1329  BP:  129/67  Pulse: 62 63  Resp:  16  Temp:  (!) 36.2 C  SpO2: 96% 96%    Last Pain:  Vitals:   02/26/22 1329  TempSrc: Temporal  PainSc:                  Martha Clan

## 2022-04-03 ENCOUNTER — Encounter: Payer: Self-pay | Admitting: *Deleted

## 2022-04-04 ENCOUNTER — Ambulatory Visit
Admission: RE | Admit: 2022-04-04 | Discharge: 2022-04-04 | Disposition: A | Discharge status: Home / Self Care | Payer: Medicare HMO | Attending: Gastroenterology | Admitting: Gastroenterology

## 2022-04-04 ENCOUNTER — Ambulatory Visit: Payer: Medicare HMO | Admitting: Anesthesiology

## 2022-04-04 ENCOUNTER — Encounter
Admission: RE | Disposition: A | Discharge status: Home / Self Care | Payer: Self-pay | Source: Home / Self Care | Attending: Gastroenterology

## 2022-04-04 DIAGNOSIS — Z9049 Acquired absence of other specified parts of digestive tract: Secondary | ICD-10-CM | POA: Insufficient documentation

## 2022-04-04 DIAGNOSIS — J45909 Unspecified asthma, uncomplicated: Secondary | ICD-10-CM | POA: Insufficient documentation

## 2022-04-04 DIAGNOSIS — R1013 Epigastric pain: Secondary | ICD-10-CM | POA: Insufficient documentation

## 2022-04-04 DIAGNOSIS — F418 Other specified anxiety disorders: Secondary | ICD-10-CM | POA: Insufficient documentation

## 2022-04-04 DIAGNOSIS — Z9884 Bariatric surgery status: Secondary | ICD-10-CM | POA: Diagnosis not present

## 2022-04-04 DIAGNOSIS — Z980 Intestinal bypass and anastomosis status: Secondary | ICD-10-CM | POA: Insufficient documentation

## 2022-04-04 HISTORY — PX: ESOPHAGOGASTRODUODENOSCOPY (EGD) WITH PROPOFOL: SHX5813

## 2022-04-04 SURGERY — ESOPHAGOGASTRODUODENOSCOPY (EGD) WITH PROPOFOL
Anesthesia: General

## 2022-04-04 MED ORDER — PROPOFOL 10 MG/ML IV BOLUS
INTRAVENOUS | Status: DC | PRN
  Administered 2022-04-04: 100 mg via INTRAVENOUS

## 2022-04-04 MED ORDER — PROPOFOL 500 MG/50ML IV EMUL
INTRAVENOUS | Status: DC | PRN
  Administered 2022-04-04: 200 ug/kg/min via INTRAVENOUS

## 2022-04-04 MED ORDER — SODIUM CHLORIDE 0.9 % IV SOLN
INTRAVENOUS | Status: DC
  Administered 2022-04-04: 20 mL/h via INTRAVENOUS

## 2022-04-04 NOTE — Anesthesia Postprocedure Evaluation (Signed)
Anesthesia Post Note  Patient: Pegeen Stiger  Procedure(s) Performed: ESOPHAGOGASTRODUODENOSCOPY (EGD) WITH PROPOFOL  Patient location during evaluation: PACU Anesthesia Type: General Level of consciousness: awake and awake and alert Pain management: satisfactory to patient Vital Signs Assessment: post-procedure vital signs reviewed and stable Respiratory status: spontaneous breathing and nonlabored ventilation Cardiovascular status: stable Anesthetic complications: no   No notable events documented.   Last Vitals:  Vitals:   04/04/22 1202 04/04/22 1212  BP: 123/84 132/78  Pulse: 63   Resp: 15 17  Temp:    SpO2: 100%     Last Pain:  Vitals:   04/04/22 1212  TempSrc:   PainSc: 0-No pain                 VAN STAVEREN,Makia Bossi

## 2022-04-04 NOTE — Op Note (Signed)
[facility] Patient Name: Nancy Duran Procedure Date: 04/04/2022 11:25 AM MRN: [id number] Account #: [id number] Date of Birth: October 20, 1960 Admit Type: Outpatient Age: 61 Room: [facility] ENDO ROOM 3 Gender: Female Note Status: Finalized Instrument Name: Upper Endoscope [id number] Procedure:             Upper GI endoscopy Indications:           Dyspepsia Providers:             Andrey Farmer MD, MD Referring MD:          No Local Md, MD (Referring MD) Medicines:             Monitored Anesthesia Care Complications:         No immediate complications. Estimated blood loss:                         Minimal. Procedure:             Pre-Anesthesia Assessment:                        - Prior to the procedure, a History and Physical was                         performed, and patient medications and allergies were                         reviewed. The patient is competent. The risks and                         benefits of the procedure and the sedation options and                         risks were discussed with the patient. All questions                         were answered and informed consent was obtained.                         Patient identification and proposed procedure were                         verified by the physician, the nurse, the                         anesthesiologist, the anesthetist and the technician                         in the endoscopy suite. Mental Status Examination:                         alert and oriented. Airway Examination: normal                         oropharyngeal airway and neck mobility. Respiratory                         Examination: clear to auscultation. CV Examination:  normal. Prophylactic Antibiotics: The patient does not                         require prophylactic antibiotics. Prior                         Anticoagulants: The patient has taken no previous                          anticoagulant or antiplatelet agents. ASA Grade                         Assessment: II - A patient with mild systemic disease.                         After reviewing the risks and benefits, the patient                         was deemed in satisfactory condition to undergo the                         procedure. The anesthesia plan was to use monitored                         anesthesia care (MAC). Immediately prior to                         administration of medications, the patient was                         re-assessed for adequacy to receive sedatives. The                         heart rate, respiratory rate, oxygen saturations,                         blood pressure, adequacy of pulmonary ventilation, and                         response to care were monitored throughout the                         procedure. The physical status of the patient was                         re-assessed after the procedure.                        After obtaining informed consent, the endoscope was                         passed under direct vision. Throughout the procedure,                         the patient's blood pressure, pulse, and oxygen                         saturations were monitored continuously. The Endoscope  was introduced through the mouth, and advanced to the                         jejunal crest. The upper GI endoscopy was accomplished                         without difficulty. The patient tolerated the                         procedure well. Findings:      The examined esophagus was normal. Biopsies were obtained from the       proximal and distal esophagus with cold forceps for histology of       suspected eosinophilic esophagitis. Estimated blood loss was minimal.      Evidence of a Roux-en-Y gastrojejunostomy was found. The gastrojejunal       anastomosis was characterized by healthy appearing mucosa. This was       traversed. The pouch-to-jejunum limb was  characterized by healthy       appearing mucosa. The jejunojejunal anastomosis was characterized by       healthy appearing mucosa. The duodenum-to-jejunum limb was examined. Impression:            - Normal esophagus. Biopsied.                        - Roux-en-Y gastrojejunostomy with gastrojejunal                         anastomosis characterized by healthy appearing mucosa. Recommendation:        - Discharge patient to home.                        - Resume previous diet.                        - Continue present medications.                        - Await pathology results.                        - Return to referring physician as previously                         scheduled. Procedure Code(s):     --- Professional ---                        [phone], Esophagogastroduodenoscopy, flexible,                         transoral; with biopsy, single or multiple Diagnosis Code(s):     --- Professional ---                        Z98.0, Intestinal bypass and anastomosis status                        R10.13, Epigastric pain CPT copyright 2019 [employer] Association. All rights reserved. The codes documented in this report are preliminary and upon coder review may  be revised to meet current compliance requirements. Andrey Farmer MD,  MD 04/04/2022 11:56:30 AM Number of Addenda: 0 Note Initiated On: 04/04/2022 11:25 AM Estimated Blood Loss:  Estimated blood loss was minimal.      [facility]

## 2022-04-04 NOTE — H&P (Signed)
Outpatient short stay form Pre-procedure 04/04/2022  Lesly Rubenstein, MD  Primary Physician: Sherre Scarlet, PA-C  Reason for visit:  Dyspepsia  History of present illness:    61 y/o lady with history of MS and roux-en-y gastric bypass here for EGD for dyspeptic symptoms. Recently with cholecystectomy. No blood thinners. No family history of GI malignancies but sister with swallowing difficulties as well. She denies true dysphagia but notes she just feels things as they go down.    Current Facility-Administered Medications:    0.9 %  sodium chloride infusion, , Intravenous, Continuous, Krystall Kruckenberg, Hilton Cork, MD, Last Rate: 20 mL/hr at 04/04/22 1024, 20 mL/hr at 04/04/22 1024  Medications Prior to Admission  Medication Sig Dispense Refill Last Dose   acetaminophen (TYLENOL) 500 MG tablet Take 1,000 mg by mouth every 6 (six) hours as needed.   04/03/2022   albuterol (VENTOLIN HFA) 108 (90 Base) MCG/ACT inhaler Inhale 1-2 puffs into the lungs every 6 (six) hours as needed for wheezing or shortness of breath. (Patient taking differently: Inhale 1-2 puffs into the lungs every 6 (six) hours as needed for wheezing or shortness of breath. Not used recently.) 8 g 0 04/03/2022   baclofen (LIORESAL) 10 MG tablet Take 1 tablet (10 mg total) by mouth 3 (three) times daily as needed for muscle spasms. 30 each 0 04/03/2022   cholecalciferol (VITAMIN D) 1000 units tablet Take 4,000 Units daily by mouth.    04/03/2022   DULoxetine (CYMBALTA) 30 MG capsule Take 30 mg by mouth daily.   04/03/2022   fluticasone (FLONASE) 50 MCG/ACT nasal spray Place 2 sprays into both nostrils daily. 16 g 0 04/03/2022   gabapentin (NEURONTIN) 600 MG tablet Take 600 mg by mouth 3 (three) times daily.   04/03/2022   ipratropium (ATROVENT) 0.06 % nasal spray Place 2 sprays into both nostrils 4 (four) times daily. (Patient taking differently: Place 2 sprays into both nostrils 2 (two) times daily.) 15 mL 0 04/03/2022   Loratadine 10  MG CAPS Take by mouth.   04/03/2022   Multiple Vitamins-Minerals (HAIR SKIN & NAILS ADVANCED PO) Take 3 tablets by mouth daily.   04/03/2022   Ocrelizumab (OCREVUS IV) Inject into the vein.   Past Month   pantoprazole (PROTONIX) 40 MG tablet Take 40 mg by mouth daily.   04/03/2022   terbinafine (LAMISIL) 250 MG tablet Take 250 mg by mouth daily.   04/03/2022   vitamin B-12 (CYANOCOBALAMIN) 1000 MCG tablet Take 1,000 mcg by mouth daily.   04/03/2022   Glatiramer Acetate 40 MG/ML SOSY Inject into the skin. (Patient not taking: Reported on 02/25/2022)      interferon beta-1a (AVONEX) 30 MCG injection Inject into the muscle.      magnesium 30 MG tablet Take 30 mg by mouth 2 (two) times daily. (Patient not taking: Reported on 02/25/2022)      trimethoprim-polymyxin b (POLYTRIM) ophthalmic solution Place 1 drop into both eyes every 4 (four) hours. (Patient not taking: Reported on 02/25/2022) 10 mL 0      No Known Allergies   Past Medical History:  Diagnosis Date   Anemia    Anxiety    Asthma    DDD (degenerative disc disease), lumbosacral 02/05/2018   Depression    Hyperlipidemia    MS (multiple sclerosis) (HCC)    symptoms since 1990, diagnosed 2008   Neuromuscular disorder ([place])    MS   PONV (postoperative nausea and vomiting)     Review of systems:  Otherwise negative.    Physical Exam  Gen: Alert, oriented. Appears stated age.  HEENT: PERRLA. Lungs: No respiratory distress CV: RRR Abd: soft, benign, no masses Ext: No edema    Planned procedures: Proceed with EGD. The patient understands the nature of the planned procedure, indications, risks, alternatives and potential complications including but not limited to bleeding, infection, perforation, damage to internal organs and possible oversedation/side effects from anesthesia. The patient agrees and gives consent to proceed.  Please refer to procedure notes for findings, recommendations and patient disposition/instructions.      Lesly Rubenstein, MD [facility]

## 2022-04-04 NOTE — Anesthesia Procedure Notes (Signed)
Date/Time: 04/04/2022 11:43 AM  Performed by: Nelda Marseille, CRNAPre-anesthesia Checklist: Patient identified, Emergency Drugs available, Suction available, Patient being monitored and Timeout performed Oxygen Delivery Method: Nasal cannula

## 2022-04-04 NOTE — Interval H&P Note (Signed)
History and Physical Interval Note:  04/04/2022 11:33 AM  Nancy Duran  has presented today for surgery, with the diagnosis of DYSPEPESIA,ESOPHAGEAL DYSPHAGIA,RUQ PAIN.  The various methods of treatment have been discussed with the patient and family. After consideration of risks, benefits and other options for treatment, the patient has consented to  Procedure(s): ESOPHAGOGASTRODUODENOSCOPY (EGD) WITH PROPOFOL (N/A) as a surgical intervention.  The patient's history has been reviewed, patient examined, no change in status, stable for surgery.  I have reviewed the patient's chart and labs.  Questions were answered to the patient's satisfaction.     Lesly Rubenstein  Ok to proceed with EGD

## 2022-04-04 NOTE — Anesthesia Preprocedure Evaluation (Signed)
Anesthesia Evaluation  Patient identified by MRN, date of birth, ID band Patient awake    Reviewed: Allergy & Precautions, NPO status , Patient's Chart, lab work & pertinent test results  Airway Mallampati: II  TM Distance: >3 FB Neck ROM: Full    Dental  (+) Teeth Intact   Pulmonary asthma , former smoker,    breath sounds clear to auscultation       Cardiovascular Exercise Tolerance: Good  Rhythm:Regular     Neuro/Psych Anxiety Depression    GI/Hepatic negative GI ROS, Neg liver ROS,   Endo/Other  negative endocrine ROS  Renal/GU negative Renal ROS  negative genitourinary   Musculoskeletal   Abdominal Normal abdominal exam  (+)   Peds negative pediatric ROS (+)  Hematology   Anesthesia Other Findings   Reproductive/Obstetrics                             Anesthesia Physical Anesthesia Plan  ASA: 2  Anesthesia Plan: General   Post-op Pain Management:    Induction: Intravenous  PONV Risk Score and Plan:   Airway Management Planned: Natural Airway  Additional Equipment:   Intra-op Plan:   Post-operative Plan:   Informed Consent: I have reviewed the patients History and Physical, chart, labs and discussed the procedure including the risks, benefits and alternatives for the proposed anesthesia with the patient or authorized representative who has indicated his/her understanding and acceptance.       Plan Discussed with: CRNA and Surgeon  Anesthesia Plan Comments:         Anesthesia Quick Evaluation

## 2022-04-04 NOTE — Transfer of Care (Signed)
Immediate Anesthesia Transfer of Care Note  Patient: Nancy Duran  Procedure(s) Performed: ESOPHAGOGASTRODUODENOSCOPY (EGD) WITH PROPOFOL  Patient Location: PACU  Anesthesia Type:General  Level of Consciousness: sedated  Airway & Oxygen Therapy: Patient Spontanous Breathing and Patient connected to nasal cannula oxygen  Post-op Assessment: Report given to RN and Post -op Vital signs reviewed and stable  Post vital signs: Reviewed and stable  Last Vitals:  Vitals Value Taken Time  BP    Temp    Pulse    Resp    SpO2      Last Pain:  Vitals:   04/04/22 1010  TempSrc: Temporal  PainSc: 6          Complications: No notable events documented.

## 2022-04-08 ENCOUNTER — Encounter: Payer: Self-pay | Admitting: Gastroenterology

## 2022-04-08 LAB — SURGICAL PATHOLOGY

## 2022-07-12 IMAGING — US US EXTREM LOW VENOUS*R*
1 series · 14 of 24 positions shown · non-contrast
Comparison: None Available.

CLINICAL DATA: Right lower leg pain, numbness and swelling

EXAM:
RIGHT LOWER EXTREMITY VENOUS DOPPLER ULTRASOUND
TECHNIQUE: Gray-scale sonography with compression, as well as color and duplex
ultrasound, were performed to evaluate the deep venous system(s)
from the level of the common femoral vein through the popliteal and
proximal calf veins.

[Series 1: us extrem low venous*right* · 0.08mm/px · 14 of 30 slices shown]
[im 1/30]
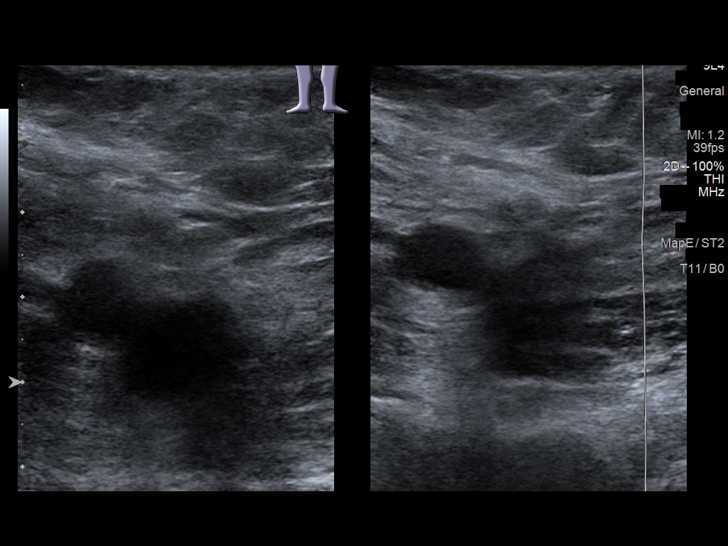
[im 3/30]
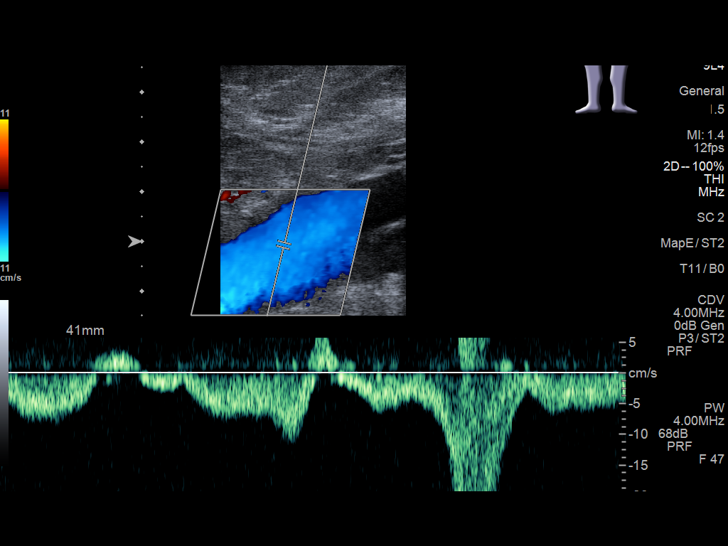
[im 6/30]
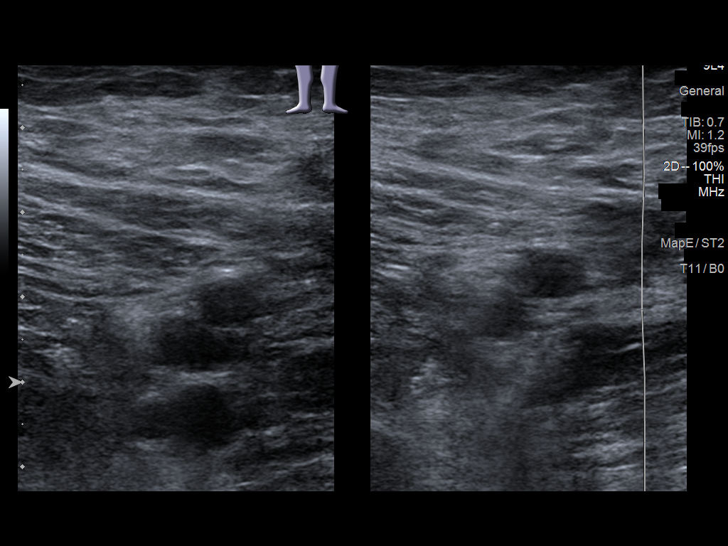
[im 8/30]
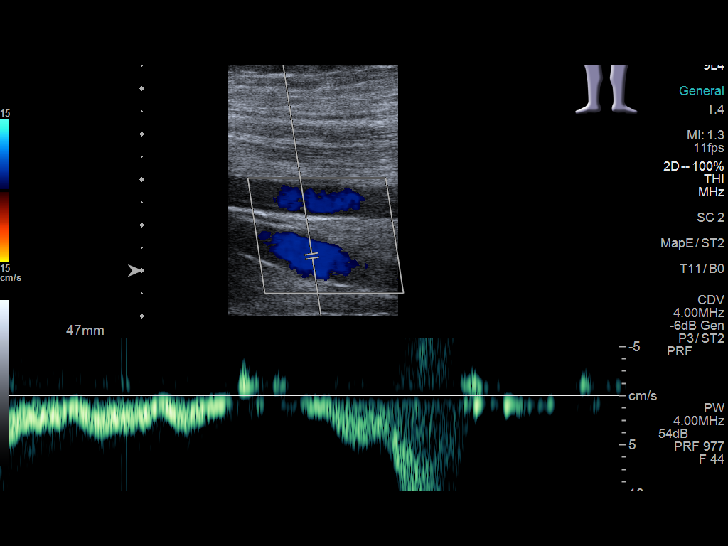
[im 9/30]
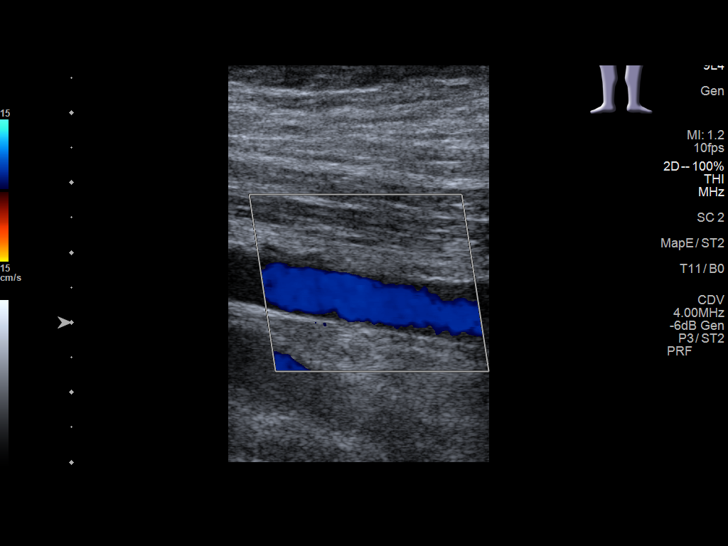
[im 12/30]
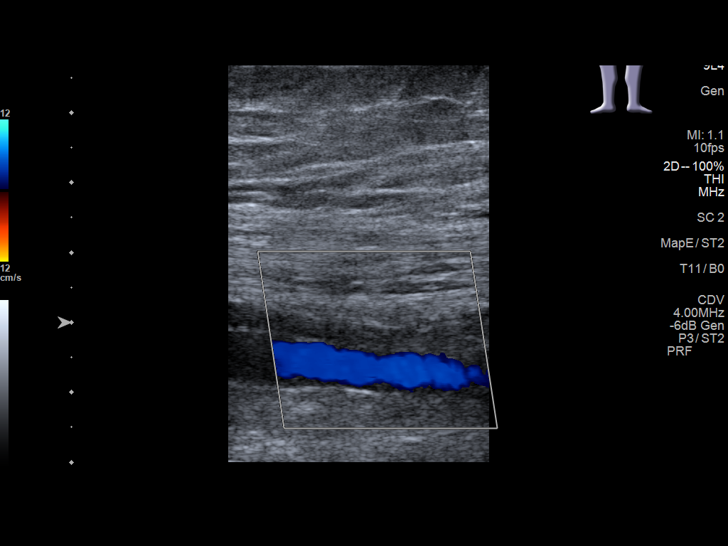
[im 14/30]
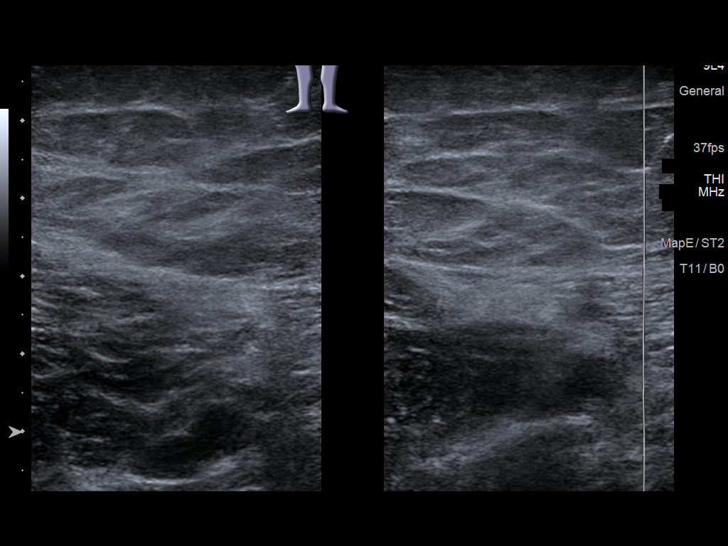
[im 16/30]
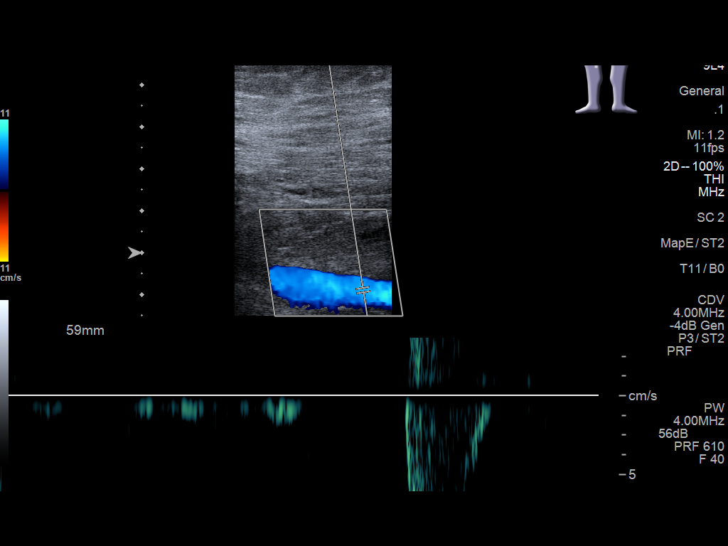
[im 18/30]
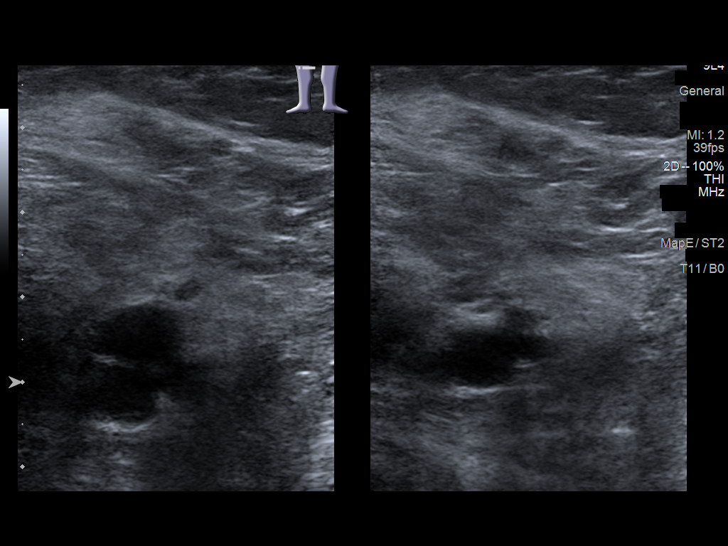
[im 21/30]
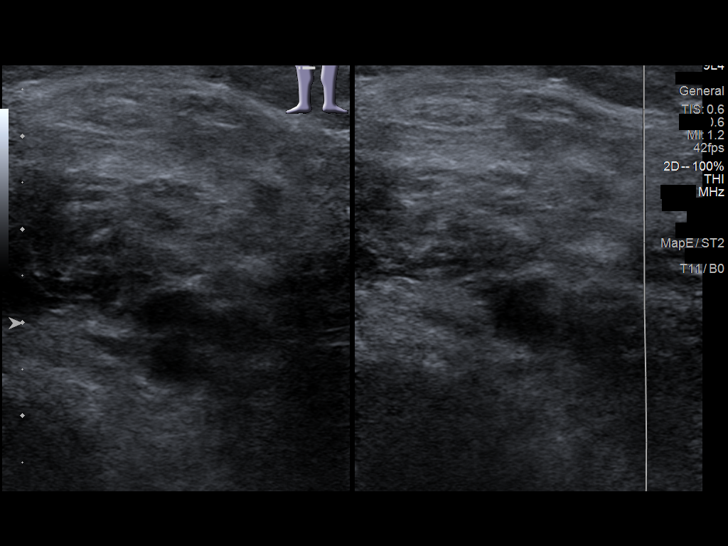
[im 23/30]
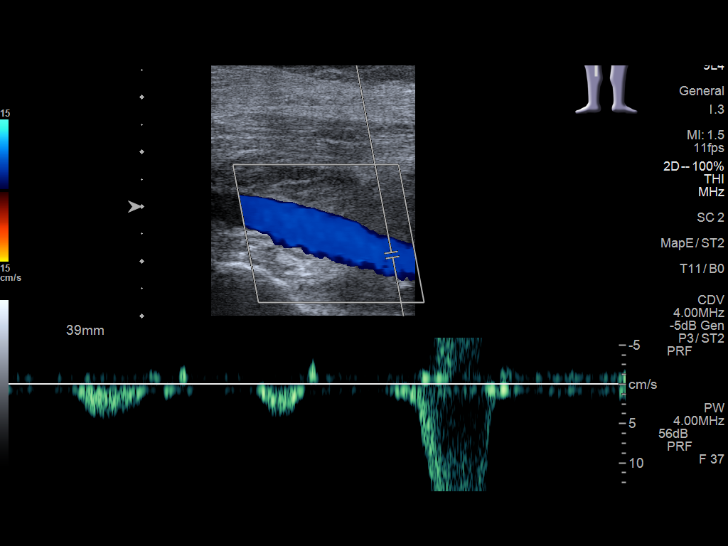
[im 24/30]
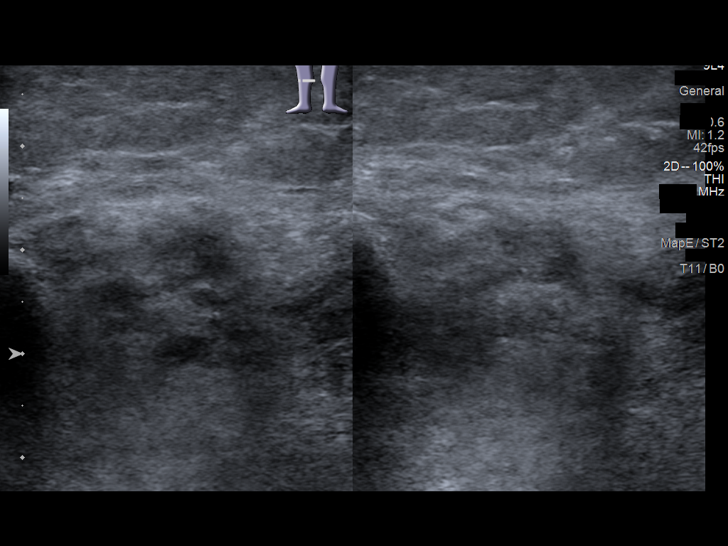
[im 27/30]
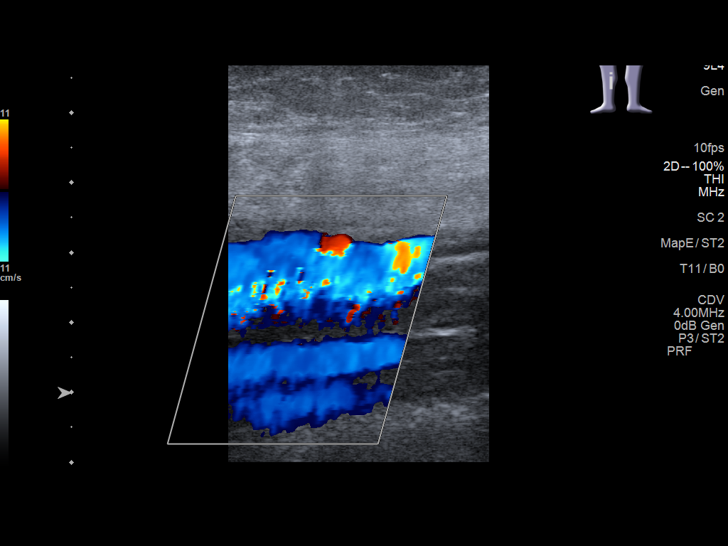
[im 30/30]
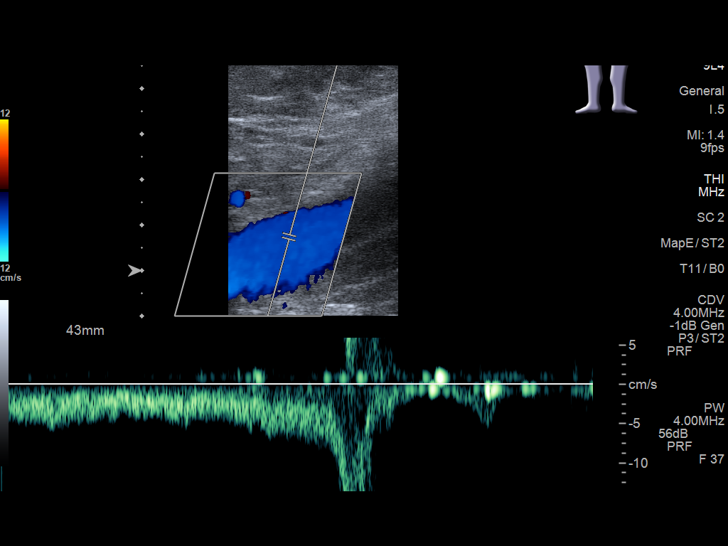

[14 of 24 positions shown; findings below may reference images not displayed]

FINDINGS: VENOUS

Normal compressibility of the common femoral, superficial femoral,
and popliteal veins, as well as the visualized calf veins.
Visualized portions of profunda femoral vein and great saphenous
vein unremarkable. No filling defects to suggest DVT on grayscale or
color Doppler imaging. Doppler waveforms show normal direction of
venous flow, normal respiratory plasticity and response to
augmentation.

Limited views of the contralateral common femoral vein are
unremarkable.

OTHER

None.

Limitations: none
IMPRESSION: Negative.

## 2022-09-01 DIAGNOSIS — U071 COVID-19: Secondary | ICD-10-CM

## 2022-09-01 HISTORY — DX: COVID-19: U07.1

## 2022-09-04 ENCOUNTER — Other Ambulatory Visit: Payer: Self-pay | Admitting: Podiatry

## 2022-09-05 ENCOUNTER — Ambulatory Visit
Admission: RE | Admit: 2022-09-05 | Discharge: 2022-09-05 | Disposition: A | Discharge status: Home / Self Care | Payer: Medicare HMO | Source: Ambulatory Visit | Attending: Nurse Practitioner | Admitting: Nurse Practitioner

## 2022-09-05 VITALS — BP 117/79 | HR 93 | Temp 98.5°F | Resp 14 | Ht 66.0 in | Wt 210.0 lb

## 2022-09-05 DIAGNOSIS — R051 Acute cough: Secondary | ICD-10-CM | POA: Diagnosis not present

## 2022-09-05 DIAGNOSIS — B349 Viral infection, unspecified: Secondary | ICD-10-CM

## 2022-09-05 DIAGNOSIS — U071 COVID-19: Secondary | ICD-10-CM

## 2022-09-05 LAB — RESP PANEL BY RT-PCR (RSV, FLU A&B, COVID)  RVPGX2
Influenza A by PCR: NEGATIVE
Influenza B by PCR: NEGATIVE
Resp Syncytial Virus by PCR: NEGATIVE
SARS Coronavirus 2 by RT PCR: POSITIVE — AB

## 2022-09-05 LAB — BASIC METABOLIC PANEL
Anion gap: 8 (ref 5–15)
BUN: 15 mg/dL (ref 8–23)
CO2: 28 mmol/L (ref 22–32)
Calcium: 8.3 mg/dL — ABNORMAL LOW (ref 8.9–10.3)
Chloride: 103 mmol/L (ref 98–111)
Creatinine, Ser: 0.71 mg/dL (ref 0.44–1.00)
GFR, Estimated: >60 mL/min (ref >60)
Glucose, Bld: 80 mg/dL (ref 70–99)
Potassium: 3.9 mmol/L (ref 3.5–5.1)
Sodium: 139 mmol/L (ref 135–145)

## 2022-09-05 MED ORDER — PSEUDOEPH-BROMPHEN-DM 30-2-10 MG/5ML PO SYRP: 10.0000 mL | ORAL_SOLUTION | Freq: Four times a day (QID) | ORAL | 0 refills | Status: AC | PRN

## 2022-09-05 MED ORDER — NIRMATRELVIR/RITONAVIR (PAXLOVID)TABLET: 3.0000 | ORAL_TABLET | Freq: Two times a day (BID) | ORAL | 0 refills | Status: AC

## 2022-09-05 NOTE — ED Provider Notes (Signed)
[facility]    CSN: [id number] Arrival date & time: 09/05/22  1355      History   Chief Complaint Chief Complaint  Patient presents with   Cough   Generalized Body Aches    HPI Nancy Duran is a 62 y.o. female presenting for 4-day history of nasal congestion, body aches, headaches, and cough.  Patient also reports mild sore throat.  She says her nasal mucus is greenish and yellow.  Patient denies any fevers.  Has been somewhat achy but does have history of MS.  No significant increase in fatigue.   Patient denies any sick contacts and no known exposure to COVID-19.  Positive at home COVID test yesterday.  Denies breathing difficulty, vomiting or diarrhea.  Has been taking Alka Seltzer over-the-counter without much improvement in symptoms.  No other complaints.  HPI  Past Medical History:  Diagnosis Date   Anemia    Anxiety    Asthma    DDD (degenerative disc disease), lumbosacral 02/05/2018   Depression    Hyperlipidemia    MS (multiple sclerosis) (HCC)    symptoms since 1990, diagnosed 2008   Neuromuscular disorder ([place])    MS   PONV (postoperative nausea and vomiting)     There are no problems to display for this patient.   Past Surgical History:  Procedure Laterality Date   ABDOMINAL HYSTERECTOMY     BREAST EXCISIONAL BIOPSY Left 1990's   NEG   BREAST SURGERY     CESAREAN SECTION     CHOLECYSTECTOMY     COLONOSCOPY WITH PROPOFOL N/A 06/11/2018   Procedure: COLONOSCOPY WITH PROPOFOL;  Surgeon: Lollie Sails, MD;  Location: [facility] ENDOSCOPY;  Service: Endoscopy;  Laterality: N/A;   ESOPHAGOGASTRODUODENOSCOPY N/A 06/11/2018   Procedure: ESOPHAGOGASTRODUODENOSCOPY (EGD);  Surgeon: Lollie Sails, MD;  Location[facility]COPY;  Service: Endoscopy;  Laterality: N/A;   ESOPHAGOGASTRODUODENOSCOPY (EGD) WITH PROPOFOL N/A 04/04/2022   Procedure: ESOPHAGOGASTRODUODENOSCOPY (EGD) WITH PROPOFOL;  Surgeon: Lesly Rubenstein, MD;  Location: [facility]  ENDOSCOPY;  Service: Endoscopy;  Laterality: N/A;   GASTRIC BYPASS     INCISE AND DRAIN ABCESS Right    finger   MYOMECTOMY     TONSILLECTOMY      OB History   No obstetric history on file.      Home Medications    Prior to Admission medications   Medication Sig Start Date End Date Taking? Authorizing Provider  baclofen (LIORESAL) 10 MG tablet Take 1 tablet (10 mg total) by mouth 3 (three) times daily as needed for muscle spasms. 12/19/21  Yes Laurene Footman B, PA-C  brompheniramine-pseudoephedrine-DM 30-2-10 MG/5ML syrup Take 10 mLs by mouth 4 (four) times daily as needed for up to 7 days. 09/05/22 09/12/22 Yes Danton Clap, PA-C  cholecalciferol (VITAMIN D) 1000 units tablet Take 4,000 Units daily by mouth.    Yes [provider]  DULoxetine (CYMBALTA) 30 MG capsule Take 30 mg by mouth daily.   Yes [provider]  fluticasone (FLONASE) 50 MCG/ACT nasal spray Place 2 sprays into both nostrils daily. 12/05/15  Yes Frederich Cha, MD  gabapentin (NEURONTIN) 600 MG tablet Take 600 mg by mouth 3 (three) times daily. 04/24/21  Yes [provider]  Loratadine 10 MG CAPS Take by mouth.   Yes [provider]  magnesium 30 MG tablet Take 30 mg by mouth 2 (two) times daily.   Yes [provider]  Multiple Vitamins-Minerals (HAIR SKIN & NAILS ADVANCED PO) Take 3  tablets by mouth daily.   Yes [provider]  nirmatrelvir/ritonavir (PAXLOVID) 20 x 150 MG & 10 x '100MG'$  TABS Take 3 tablets by mouth 2 (two) times daily for 5 days. Patient GFR is 60. Take nirmatrelvir (150 mg) two tablets twice daily for 5 days and ritonavir (100 mg) one tablet twice daily for 5 days. 09/05/22 09/10/22 Yes Danton Clap, PA-C  Ocrelizumab (OCREVUS IV) Inject into the vein.   Yes [provider]  pantoprazole (PROTONIX) 40 MG tablet Take 40 mg by mouth daily.   Yes [provider]  vitamin B-12 (CYANOCOBALAMIN) 1000 MCG tablet Take 1,000 mcg by mouth daily.    Yes [provider]  acetaminophen (TYLENOL) 500 MG tablet Take 1,000 mg by mouth every 6 (six) hours as needed.    [provider]  albuterol (VENTOLIN HFA) 108 (90 Base) MCG/ACT inhaler Inhale 1-2 puffs into the lungs every 6 (six) hours as needed for wheezing or shortness of breath. Patient taking differently: Inhale 1-2 puffs into the lungs every 6 (six) hours as needed for wheezing or shortness of breath. Not used recently. 09/23/19   Norval Gable, MD  Glatiramer Acetate 40 MG/ML SOSY Inject into the skin. Patient not taking: Reported on 02/25/2022    [provider]  interferon beta-1a (AVONEX) 30 MCG injection Inject into the muscle. 05/31/19 05/30/20  [provider]  ipratropium (ATROVENT) 0.06 % nasal spray Place 2 sprays into both nostrils 4 (four) times daily. Patient taking differently: Place 2 sprays into both nostrils 2 (two) times daily. 05/08/21   Danton Clap, PA-C  terbinafine (LAMISIL) 250 MG tablet Take 250 mg by mouth daily.    [provider]  trimethoprim-polymyxin b (POLYTRIM) ophthalmic solution Place 1 drop into both eyes every 4 (four) hours. Patient not taking: Reported on 02/25/2022 07/22/21   Hans Eden, NP    Family History Family History  Problem Relation Age of Onset   Breast cancer Sister 48   Breast cancer Paternal 43    Breast cancer Cousin        pat cousin    Social History Social History   Tobacco Use   Smoking status: Former    Types: Cigarettes    Quit date: 1991    Years since quitting: 33.1   Smokeless tobacco: Never  Vaping Use   Vaping Use: Never used  Substance Use Topics   Alcohol use: No   Drug use: No     Allergies   Patient has no known allergies.   Review of Systems Review of Systems  Constitutional:  Positive for fatigue. Negative for chills, diaphoresis and fever.  HENT:  Positive for congestion, mouth sores, rhinorrhea and sinus pressure. Negative for ear pain,  sinus pain and sore throat.   Respiratory:  Positive for cough. Negative for shortness of breath.   Cardiovascular:  Negative for chest pain.  Gastrointestinal:  Negative for abdominal pain, nausea and vomiting.  Musculoskeletal:  Positive for myalgias. Negative for arthralgias.  Skin:  Negative for rash.  Neurological:  Positive for headaches. Negative for weakness.  Hematological:  Negative for adenopathy.     Physical Exam Triage Vital Signs ED Triage Vitals [05/08/21 1244]  Enc Vitals Group     BP      Pulse      Resp      Temp      Temp src      SpO2      Weight 200 lb (  90.7 kg)     Height '5\' 5"'$  (1.651 m)     Head Circumference      Peak Flow      Pain Score 6     Pain Loc      Pain Edu?      Excl. in [place]?    No data found.  Updated Vital Signs BP 117/79 (BP Location: Right Arm)   Pulse 93   Temp 98.5 F (36.9 C) (Oral)   Resp 14   Ht '5\' 6"'$  (1.676 m)   Wt 210 lb (95.3 kg)   SpO2 95%   BMI 33.89 kg/m      Physical Exam Vitals and nursing note reviewed.  Constitutional:      General: She is not in acute distress.    Appearance: Normal appearance. She is well-developed. She is not ill-appearing or toxic-appearing.  HENT:     Head: Normocephalic and atraumatic.     Nose: Congestion present.     Mouth/Throat:     Mouth: Mucous membranes are moist.     Pharynx: Oropharynx is clear. No posterior oropharyngeal erythema.  Eyes:     General: No scleral icterus.       Right eye: No discharge.        Left eye: No discharge.     Conjunctiva/sclera: Conjunctivae normal.  Cardiovascular:     Rate and Rhythm: Normal rate and regular rhythm.     Heart sounds: Normal heart sounds.  Pulmonary:     Effort: Pulmonary effort is normal. No respiratory distress.     Breath sounds: Normal breath sounds.  Musculoskeletal:     Cervical back: Neck supple.  Skin:    General: Skin is dry.  Neurological:     General: No focal deficit present.     Mental Status: She is  alert. Mental status is at baseline.     Motor: No weakness.     Gait: Gait normal.  Psychiatric:        Mood and Affect: Mood normal.        Behavior: Behavior normal.        Thought Content: Thought content normal.      UC Treatments / Results  Labs (all labs ordered are listed, but only abnormal results are displayed) Labs Reviewed  RESP PANEL BY RT-PCR (RSV, FLU A&B, COVID)  RVPGX2 - Abnormal; Notable for the following components:      Result Value   SARS Coronavirus 2 by RT PCR POSITIVE (*)    All other components within normal limits  BASIC METABOLIC PANEL - Abnormal; Notable for the following components:   Calcium 8.3 (*)    All other components within normal limits    EKG   Radiology No results found.  Procedures Procedures (including critical care time)  Medications Ordered in UC Medications - No data to display  Initial Impression / Assessment and Plan / UC Course  I have reviewed the triage vital signs and the nursing notes.  Pertinent labs & imaging results that were available during my care of the patient were reviewed by me and considered in my medical decision making (see chart for details).   62 year old female presenting for cough, congestion and body aches over the past 4 days.  Has taken Mucinex without much improvement in symptoms.  Patient does also take antihistamine occasionally for allergies. Positive home COVID test.  Vital signs stable and patient overall well-appearing.  Nasal congestion on exam and light yellowish rhinorrhea.  Chest is clear to auscultation heart regular rate and rhythm.  Resp panel obtained.  BMP obtained.   Positive COVID-19.  GFR greater than 60.  Call discussed results with patient.  Current CDC guidelines, isolation protocol and ED precautions reviewed the patient.  Sent Paxlovid to pharmacy. Advised increasing rest and fluids.  I have sent Bromfed-DM to pharmacy. Advised patient upper respiratory infections may last 7  to 10 days and sometimes longer.  Advised following up if symptoms go beyond 10 days or for worsening symptoms such as fever, worsening cough or breathing difficulty.   Final Clinical Impressions(s) / UC Diagnoses   Final diagnoses:  Viral illness  Acute cough  COVID-19     Discharge Instructions      -Awaiting COVID test result and the metabolic panel to check your kidney function. - If you have COVID I will send in the Paxlovid medication.  You would need to isolate 5 days from symptom onset and wear mask for 5 days.  Tuesday started the day 1. - Increase rest and fluid intake. - I sent cough medicine to pharmacy. - If you have any uncontrolled fever, weakness or breathing difficulty, go to the emergency department.       ED Prescriptions     Medication Sig Dispense Auth. Provider   brompheniramine-pseudoephedrine-DM 30-2-10 MG/5ML syrup Take 10 mLs by mouth 4 (four) times daily as needed for up to 7 days. 150 mL Danton Clap, PA-C   nirmatrelvir/ritonavir (PAXLOVID) 20 x 150 MG & 10 x '100MG'$  TABS Take 3 tablets by mouth 2 (two) times daily for 5 days. Patient GFR is 60. Take nirmatrelvir (150 mg) two tablets twice daily for 5 days and ritonavir (100 mg) one tablet twice daily for 5 days. 30 tablet Gretta Cool      PDMP not reviewed this encounter.     Danton Clap, PA-C 09/05/22 1539

## 2022-09-05 NOTE — Discharge Instructions (Signed)
-  Awaiting COVID test result and the metabolic panel to check your kidney function. - If you have COVID I will send in the Paxlovid medication.  You would need to isolate 5 days from symptom onset and wear mask for 5 days.  Tuesday started the day 1. - Increase rest and fluid intake. - I sent cough medicine to pharmacy. - If you have any uncontrolled fever, weakness or breathing difficulty, go to the emergency department.

## 2022-09-05 NOTE — ED Triage Notes (Signed)
Patient c/o cough, congestion, and bodyaches that started on Monday.  Patient states that she took home covid test yesterday and was positive.  Patient unsure of fevers.

## 2022-09-09 ENCOUNTER — Encounter: Payer: Self-pay | Admitting: Podiatry

## 2022-09-09 ENCOUNTER — Other Ambulatory Visit: Payer: Self-pay

## 2022-09-11 NOTE — Discharge Instructions (Signed)
[facility]  POST OPERATIVE INSTRUCTIONS FOR DR. FOWLER AND [facility]PART[facility]   Take your medication as prescribed.  Pain medication should be taken only as needed.  Keep the dressing clean, dry and intact.  Keep your foot elevated above the heart level for the first 48 hours.  Walking to the bathroom and brief periods of walking are acceptable, unless we have instructed you to be non-weight bearing.  Always wear your post-op shoe when walking.  Always use your crutches if you are to be non-weight bearing.  Do not take a shower. Baths are permissible as long as the foot is kept out of the water.   Every hour you are awake:  Bend your knee 15 times. Flex foot 15 times Massage calf 15 times  Call [facility] ([phone]) if any of the following problems occur: You develop a temperature or fever. The bandage becomes saturated with blood. Medication does not stop your pain. Injury of the foot occurs. Any symptoms of infection including redness, odor, or red streaks running from wound. 

## 2022-09-17 ENCOUNTER — Ambulatory Visit: Payer: Medicare HMO | Admitting: Anesthesiology

## 2022-09-17 ENCOUNTER — Ambulatory Visit
Admission: RE | Admit: 2022-09-17 | Discharge: 2022-09-17 | Disposition: A | Discharge status: Home / Self Care | Payer: Medicare HMO | Source: Ambulatory Visit | Attending: Podiatry | Admitting: Podiatry

## 2022-09-17 ENCOUNTER — Ambulatory Visit: Payer: Self-pay

## 2022-09-17 ENCOUNTER — Encounter
Admission: RE | Disposition: A | Discharge status: Home / Self Care | Payer: Self-pay | Source: Ambulatory Visit | Attending: Podiatry

## 2022-09-17 ENCOUNTER — Encounter: Payer: Self-pay | Admitting: Podiatry

## 2022-09-17 ENCOUNTER — Other Ambulatory Visit: Payer: Self-pay

## 2022-09-17 DIAGNOSIS — Z09 Encounter for follow-up examination after completed treatment for conditions other than malignant neoplasm: Secondary | ICD-10-CM | POA: Diagnosis not present

## 2022-09-17 DIAGNOSIS — E785 Hyperlipidemia, unspecified: Secondary | ICD-10-CM | POA: Insufficient documentation

## 2022-09-17 DIAGNOSIS — M2011 Hallux valgus (acquired), right foot: Secondary | ICD-10-CM | POA: Diagnosis not present

## 2022-09-17 DIAGNOSIS — M2021 Hallux rigidus, right foot: Secondary | ICD-10-CM | POA: Insufficient documentation

## 2022-09-17 DIAGNOSIS — J45909 Unspecified asthma, uncomplicated: Secondary | ICD-10-CM | POA: Insufficient documentation

## 2022-09-17 DIAGNOSIS — G35 Multiple sclerosis: Secondary | ICD-10-CM | POA: Insufficient documentation

## 2022-09-17 DIAGNOSIS — D649 Anemia, unspecified: Secondary | ICD-10-CM | POA: Diagnosis not present

## 2022-09-17 DIAGNOSIS — Z87891 Personal history of nicotine dependence: Secondary | ICD-10-CM | POA: Insufficient documentation

## 2022-09-17 DIAGNOSIS — F418 Other specified anxiety disorders: Secondary | ICD-10-CM | POA: Insufficient documentation

## 2022-09-17 HISTORY — DX: Pain in right foot: M79.671

## 2022-09-17 HISTORY — PX: ARTHRODESIS METATARSALPHALANGEAL JOINT (MTPJ): SHX6566

## 2022-09-17 HISTORY — DX: Dizziness and giddiness: R42

## 2022-09-17 SURGERY — FUSION, JOINT, GREAT TOE
Anesthesia: General | Site: Toe | Laterality: Right

## 2022-09-17 MED ORDER — OXYCODONE-ACETAMINOPHEN 5-325 MG PO TABS: 1.0000 | ORAL_TABLET | Freq: Four times a day (QID) | ORAL | 0 refills | Status: AC | PRN

## 2022-09-17 MED ORDER — BUPIVACAINE HCL (PF) 0.25 % IJ SOLN
INTRAMUSCULAR | Status: DC | PRN
  Administered 2022-09-17: 10 mL

## 2022-09-17 MED ORDER — LACTATED RINGERS IV SOLN: INTRAVENOUS | Status: DC

## 2022-09-17 MED ORDER — METOCLOPRAMIDE HCL 5 MG PO TABS: 5.0000 mg | ORAL_TABLET | Freq: Three times a day (TID) | ORAL | Status: DC | PRN

## 2022-09-17 MED ORDER — MIDAZOLAM HCL 5 MG/5ML IJ SOLN
INTRAMUSCULAR | Status: DC | PRN
  Administered 2022-09-17: 2 mg via INTRAVENOUS

## 2022-09-17 MED ORDER — DEXAMETHASONE SODIUM PHOSPHATE 4 MG/ML IJ SOLN
INTRAMUSCULAR | Status: DC | PRN
  Administered 2022-09-17: 4 mg via INTRAVENOUS

## 2022-09-17 MED ORDER — ONDANSETRON HCL 4 MG PO TABS: 4.0000 mg | ORAL_TABLET | Freq: Four times a day (QID) | ORAL | Status: DC | PRN

## 2022-09-17 MED ORDER — CEFAZOLIN SODIUM-DEXTROSE 2-4 GM/100ML-% IV SOLN
2.0000 g | INTRAVENOUS | Status: AC
  Administered 2022-09-17: 2 g via INTRAVENOUS

## 2022-09-17 MED ORDER — ONDANSETRON HCL 4 MG/2ML IJ SOLN: 4.0000 mg | Freq: Four times a day (QID) | INTRAMUSCULAR | Status: DC | PRN

## 2022-09-17 MED ORDER — ONDANSETRON HCL 4 MG/2ML IJ SOLN
INTRAMUSCULAR | Status: DC | PRN
  Administered 2022-09-17: 4 mg via INTRAVENOUS

## 2022-09-17 MED ORDER — SODIUM CHLORIDE 0.9 % IR SOLN
Status: DC | PRN
  Administered 2022-09-17: 500 mL

## 2022-09-17 MED ORDER — PROPOFOL 10 MG/ML IV BOLUS
INTRAVENOUS | Status: DC | PRN
  Administered 2022-09-17: 200 mg via INTRAVENOUS

## 2022-09-17 MED ORDER — FENTANYL CITRATE (PF) 100 MCG/2ML IJ SOLN
INTRAMUSCULAR | Status: DC | PRN
  Administered 2022-09-17 (×2): 25 ug via INTRAVENOUS
  Administered 2022-09-17: 50 ug via INTRAVENOUS

## 2022-09-17 MED ORDER — PROPOFOL 500 MG/50ML IV EMUL
INTRAVENOUS | Status: DC | PRN
  Administered 2022-09-17: 160 ug/kg/min via INTRAVENOUS

## 2022-09-17 MED ORDER — BUPIVACAINE LIPOSOME 1.3 % IJ SUSP
INTRAMUSCULAR | Status: DC | PRN
  Administered 2022-09-17: 10 mL

## 2022-09-17 MED ORDER — METOCLOPRAMIDE HCL 5 MG/ML IJ SOLN: 5.0000 mg | Freq: Three times a day (TID) | INTRAMUSCULAR | Status: DC | PRN

## 2022-09-17 MED ORDER — LIDOCAINE HCL (CARDIAC) PF 100 MG/5ML IV SOSY
PREFILLED_SYRINGE | INTRAVENOUS | Status: DC | PRN
  Administered 2022-09-17: 60 mg via INTRATRACHEAL

## 2022-09-17 SURGICAL SUPPLY — 55 items
APL SKNCLS STERI-STRIP NONHPOA (GAUZE/BANDAGES/DRESSINGS) ×1
BENZOIN TINCTURE PRP APPL 2/3 (GAUZE/BANDAGES/DRESSINGS) ×1 IMPLANT
BIT DRILL 2 FAST STEP (BIT) IMPLANT
BIT DRILL 2.4 AO COUPLING CANN (BIT) IMPLANT
BNDG CMPR 5X4 CHSV STRCH STRL (GAUZE/BANDAGES/DRESSINGS) ×1
BNDG CMPR 75X41 PLY HI ABS (GAUZE/BANDAGES/DRESSINGS) ×1
BNDG CMPR STD VLCR NS LF 5.8X4 (GAUZE/BANDAGES/DRESSINGS) ×1
BNDG COHESIVE 4X5 TAN STRL LF (GAUZE/BANDAGES/DRESSINGS) IMPLANT
BNDG ELASTIC 4X5.8 VLCR NS LF (GAUZE/BANDAGES/DRESSINGS) IMPLANT
BNDG ELASTIC 4X5.8 VLCR STR LF (GAUZE/BANDAGES/DRESSINGS) ×1 IMPLANT
BNDG ESMARCH 4 X 12 STRL LF (GAUZE/BANDAGES/DRESSINGS) ×1
BNDG ESMARCH 4X12 STRL LF (GAUZE/BANDAGES/DRESSINGS) IMPLANT
BNDG GAUZE DERMACEA FLUFF 4 (GAUZE/BANDAGES/DRESSINGS) IMPLANT
BNDG GZE DERMACEA 4 6PLY (GAUZE/BANDAGES/DRESSINGS) ×1
BNDG STRETCH 4X75 STRL LF (GAUZE/BANDAGES/DRESSINGS) ×1 IMPLANT
BOOT STEPPER DURA MED (SOFTGOODS) IMPLANT
CANISTER SUCT 1200ML W/VALVE (MISCELLANEOUS) ×1 IMPLANT
COVER LIGHT HANDLE UNIVERSAL (MISCELLANEOUS) ×2 IMPLANT
DRAPE FLUOR MINI C-ARM 54X84 (DRAPES) ×1 IMPLANT
DURAPREP 26ML APPLICATOR (WOUND CARE) ×1 IMPLANT
ELECT REM PT RETURN 9FT ADLT (ELECTROSURGICAL) ×1
ELECTRODE REM PT RTRN 9FT ADLT (ELECTROSURGICAL) ×1 IMPLANT
GAUZE SPONGE 4X4 12PLY STRL (GAUZE/BANDAGES/DRESSINGS) ×1 IMPLANT
GAUZE XEROFORM 1X8 LF (GAUZE/BANDAGES/DRESSINGS) ×1 IMPLANT
GLOVE SRG 8 PF TXTR STRL LF DI (GLOVE) ×2 IMPLANT
GLOVE SURG ENC MOIS LTX SZ7.5 (GLOVE) ×2 IMPLANT
GLOVE SURG UNDER POLY LF SZ8 (GLOVE) ×2
GOWN STRL REUS W/ TWL LRG LVL3 (GOWN DISPOSABLE) ×2 IMPLANT
GOWN STRL REUS W/TWL LRG LVL3 (GOWN DISPOSABLE) ×2
K-WIRE ACE 1.6X6 (WIRE) ×3
K-WIRE TROC 1.25X150 (WIRE) ×1
KIT TURNOVER KIT A (KITS) ×1 IMPLANT
KWIRE ACE 1.6X6 (WIRE) IMPLANT
KWIRE TROC 1.25X150 (WIRE) IMPLANT
NS IRRIG 500ML POUR BTL (IV SOLUTION) ×1 IMPLANT
PACK EXTREMITY ARMC (MISCELLANEOUS) ×1 IMPLANT
PLATE LOCK 1ST RT MTP (Plate) IMPLANT
RASP SM TEAR CROSS CUT (RASP) IMPLANT
SCREW CANN PT 4X32 NS (Screw) IMPLANT
SCREW CANNULATED 4.0X32MM (Screw) ×1 IMPLANT
SCREW PEG 2.5X20 NONLOCK - LOG1072674 (Screw) IMPLANT
SCREW PEG 2.5X24 NONLOCK (Screw) IMPLANT
SCREW PEG LOCK 2.5X12 (Screw) IMPLANT
SCREW PEG LOCK 2.5X14 (Peg) IMPLANT
SCREW PEG LOCK 2.5X16 (Peg) IMPLANT
STOCKINETTE IMPERVIOUS LG (DRAPES) ×1 IMPLANT
STRIP CLOSURE SKIN 1/4X4 (GAUZE/BANDAGES/DRESSINGS) ×1 IMPLANT
SUT ETHILON 3-0 (SUTURE) IMPLANT
SUT MNCRL 4-0 (SUTURE) ×1
SUT MNCRL 4-0 27XMFL (SUTURE) ×1
SUT VIC AB 3-0 SH 27 (SUTURE) ×1
SUT VIC AB 3-0 SH 27X BRD (SUTURE) IMPLANT
SUT VIC AB 4-0 SH 27 (SUTURE) ×1
SUT VIC AB 4-0 SH 27XANBCTRL (SUTURE) IMPLANT
SUTURE MNCRL 4-0 27XMF (SUTURE) IMPLANT

## 2022-09-17 NOTE — Transfer of Care (Signed)
Immediate Anesthesia Transfer of Care Note  Patient: Nancy Duran  Procedure(s) Performed: ARTHRODESIS METATARSALPHALANGEAL JOINT (MTPJ) (Right: Toe)  Patient Location: PACU  Anesthesia Type: General  Level of Consciousness: awake, alert  and patient cooperative  Airway and Oxygen Therapy: Patient Spontanous Breathing and Patient connected to supplemental oxygen  Post-op Assessment: Post-op Vital signs reviewed, Patient's Cardiovascular Status Stable, Respiratory Function Stable, Patent Airway and No signs of Nausea or vomiting  Post-op Vital Signs: Reviewed and stable  Complications: No notable events documented.

## 2022-09-17 NOTE — Op Note (Signed)
Operative note   Surgeon:Ronnica Dreese Lawyer: None    Preop diagnosis: 1.  Hallux rigidus right first MTPJ    Postop diagnosis: Same    Procedure: 1.  First MTPJ arthrodesis right foot 2.  Intraoperative fluoroscopy without assistance of radiologist    EBL: Minimal    Anesthesia:local and general local consisted of a total of 20 cc of one-to-one mixture of 0.  2 5% bupivacaine and Exparel long-acting anesthetic    Hemostasis: Midcalf tourniquet inflated to 200 mmHg for 100 minutes    Specimen: None    Complications: None    Operative indications:Nancy Duran is an 62 y.o. that presents today for surgical intervention.  The risks/benefits/alternatives/complications have been discussed and consent has been given.    Procedure:  Patient was brought into the OR and placed on the operating table in thesupine position. After anesthesia was obtained theright lower extremity was prepped and draped in usual sterile fashion.  Attention was directed to the dorsal medial right first MTPJ where a dorsomedial incision was performed.  Sharp and blunt dissection carried down to the capsule.  A longitudinal capsulotomy was performed.  The metatarsophalangeal joint was completely exposed.  A large amount of periarticular bone spurring was noted and removed with a rongeur and smoothed with a power rasp.  At this time minimal articular cartilage was noted on the first metatarsal head with minimal articular cartilage on the base of the proximal phalanx.  Initially the cartilage was removed with a curette and then with a cup and cone reamer I was able to remove all of the residual cartilage down to the subchondral bone plate.  The joint was then prepped with a 2.0 mm drill bit.  This was then held in a neutral position.  At this time a 4.0 mm cannulated screw was driven from distal medial to proximal lateral crossing the MTPJ with good stability and alignment.  A dorsal locking plate with 2.5  millimeter screws from the [employer] foot set for the first MTPJ fusion the small stab incision for for the 4 oh cannulated screw was covered with a 3-0 nylon.  A bulky sterile dressing was applied.  Intraoperative fluoroscopy was used throughout the entire procedure both preoperative and postoperatively to evaluate placement of the hardware and alignment of the great toe joint.  Patient was placed in a medium equalizer walker boot.    Patient tolerated the procedure and anesthesia well.  Was transported from the OR to the PACU with all vital signs stable and vascular status intact. To be discharged per routine protocol.  Will follow up in approximately 1 week in the outpatient clinic.

## 2022-09-17 NOTE — H&P (Signed)
HISTORY AND PHYSICAL INTERVAL NOTE:  09/17/2022  1:34 PM  Nancy Duran  has presented today for surgery, with the diagnosis of M20.21 - Hallux rigidus of right foot M20.11 - Hallux valgus of right foot G35 - Multiple Sclerosis.  The various methods of treatment have been discussed with the patient.  No guarantees were given.  After consideration of risks, benefits and other options for treatment, the patient has consented to surgery.  I have reviewed the patients' chart and labs.     A history and physical examination was performed in my office.  The patient was reexamined.  There have been no changes to this history and physical examination.  Nancy Duran A

## 2022-09-17 NOTE — Anesthesia Postprocedure Evaluation (Signed)
Anesthesia Post Note  Patient: Meika Macaraeg  Procedure(s) Performed: ARTHRODESIS METATARSALPHALANGEAL JOINT (MTPJ) (Right: Toe)  Patient location during evaluation: PACU Anesthesia Type: General Level of consciousness: awake and alert Pain management: pain level controlled Vital Signs Assessment: post-procedure vital signs reviewed and stable Respiratory status: spontaneous breathing, nonlabored ventilation, respiratory function stable and patient connected to nasal cannula oxygen Cardiovascular status: blood pressure returned to baseline and stable Postop Assessment: no apparent nausea or vomiting Anesthetic complications: no   No notable events documented.   Last Vitals:  Vitals:   09/17/22 1615 09/17/22 1630  BP: (!) 147/93 (!) 150/89  Pulse: (!) 57 61  Resp: 12 14  Temp:    SpO2: 100% 100%    Last Pain:  Vitals:   09/17/22 1630  TempSrc:   PainSc: 0-No pain                 Martha Clan

## 2022-09-17 NOTE — Anesthesia Preprocedure Evaluation (Signed)
Anesthesia Evaluation  Patient identified by MRN, date of birth, ID band Patient awake    Reviewed: Allergy & Precautions, H&P , NPO status , Patient's Chart, lab work & pertinent test results, reviewed documented beta blocker date and time   History of Anesthesia Complications (+) PONV and history of anesthetic complications  Airway Mallampati: II  TM Distance: >3 FB Neck ROM: full    Dental  (+) Chipped, Dental Advidsory Given, Missing   Pulmonary neg shortness of breath, asthma , neg sleep apnea, neg COPD, neg recent URI, former smoker   Pulmonary exam normal breath sounds clear to auscultation       Cardiovascular Exercise Tolerance: Good Normal cardiovascular exam Rhythm:regular Rate:Normal     Neuro/Psych neg Seizures PSYCHIATRIC DISORDERS Anxiety Depression    MS  Neuromuscular disease    GI/Hepatic negative GI ROS, Neg liver ROS,,,  Endo/Other  negative endocrine ROS    Renal/GU negative Renal ROS  negative genitourinary   Musculoskeletal  (+) Arthritis ,    Abdominal   Peds  Hematology negative hematology ROS (+)   Anesthesia Other Findings Past Medical History: No date: Anemia No date: Anxiety No date: Asthma 02/05/2018: DDD (degenerative disc disease), lumbosacral No date: Depression No date: Hyperlipidemia No date: MS (multiple sclerosis) ([place])     Comment:  symptoms since 1990, diagnosed 2008 No date: Neuromuscular disorder ([place])     Comment:  MS No date: PONV (postoperative nausea and vomiting)   Reproductive/Obstetrics negative OB ROS                             Anesthesia Physical Anesthesia Plan  ASA: 3  Anesthesia Plan: General   Post-op Pain Management:    Induction: Intravenous  PONV Risk Score and Plan: 4 or greater and Ondansetron, Dexamethasone, Midazolam, Treatment may vary due to age or medical condition, Propofol infusion and TIVA  Airway  Management Planned: LMA  Additional Equipment:   Intra-op Plan:   Post-operative Plan: Extubation in OR  Informed Consent: I have reviewed the patients History and Physical, chart, labs and discussed the procedure including the risks, benefits and alternatives for the proposed anesthesia with the patient or authorized representative who has indicated his/her understanding and acceptance.     Dental Advisory Given  Plan Discussed with: CRNA  Anesthesia Plan Comments:         Anesthesia Quick Evaluation

## 2022-09-18 ENCOUNTER — Encounter: Payer: Self-pay | Admitting: Podiatry

## 2022-09-20 ENCOUNTER — Ambulatory Visit
Admission: RE | Admit: 2022-09-20 | Discharge: 2022-09-20 | Disposition: A | Discharge status: Home / Self Care | Payer: Medicare HMO | Source: Ambulatory Visit | Attending: Physician Assistant | Admitting: Physician Assistant

## 2022-09-20 VITALS — BP 122/67 | HR 69 | Temp 98.4°F | Resp 14 | Ht 66.0 in | Wt 210.1 lb

## 2022-09-20 DIAGNOSIS — R339 Retention of urine, unspecified: Secondary | ICD-10-CM | POA: Diagnosis not present

## 2022-09-20 DIAGNOSIS — Z9889 Other specified postprocedural states: Secondary | ICD-10-CM

## 2022-09-20 NOTE — ED Triage Notes (Signed)
Patient states that she had right foot surgery on Wed.  Patient states that she urinated three times early on Thursday.  Patient states that she has not urinated since Late Thursday.  Patient denies any pain.  Patient has history of MS.

## 2022-09-20 NOTE — Discharge Instructions (Signed)

## 2022-09-20 NOTE — ED Provider Notes (Signed)
[facility]    CSN: [id number] Arrival date & time: 09/20/22  1146      History   Chief Complaint Chief Complaint  Patient presents with   Urinary Retention    HPI Nancy Duran is a 62 y.o. female with history of MS.  Patient presents today because she has had difficulty urinating for the past 2 days.  States that she had surgery on her right foot on Wednesday and has not urinated since Thursday night.  She says she is not even been able to urinate a small amount.  She reports bladder fullness and pressure but denies any pain.  She has not had any pain in her back, chills, sweats.  She never had any pain with urination before she started hypertension.  No other complaints.  HPI  Past Medical History:  Diagnosis Date   Anemia    Hx of   Anxiety    Asthma    shortness of breath occasional   COVID 09/01/2022   DDD (degenerative disc disease), lumbosacral 02/05/2018   lesions on spine   Depression    Foot pain, right    arthritis   Hyperlipidemia    MS (multiple sclerosis) ([facility])    symptoms since 1990, diagnosed 2008/ neuropathy, numbness knees down, small amt in fingers   Neuromuscular disorder ([facility])    MS   PONV (postoperative nausea and vomiting)    Vertigo    HX of    There are no problems to display for this patient.   Past Surgical History:  Procedure Laterality Date   ABDOMINAL HYSTERECTOMY     ARTHRODESIS METATARSALPHALANGEAL JOINT (MTPJ) Right 09/17/2022   Procedure: ARTHRODESIS METATARSALPHALANGEAL JOINT (MTPJ);  Surgeon: [name], DPM;  Location: [facility];  Service: Podiatry;  Laterality: Right;   BREAST EXCISIONAL BIOPSY Left 1990's   NEG   BREAST SURGERY     CESAREAN SECTION     CHOLECYSTECTOMY     COLONOSCOPY WITH PROPOFOL N/A 06/11/2018   Procedure: COLONOSCOPY WITH PROPOFOL;  Surgeon: Lollie Sails, MD;  Location: [facility] END[facility]COPY;  Service: Endoscopy;  Laterality: N/A;   ESOPHAGOGASTRODUODENOSCOPY N/A  06/11/2018   Procedure: ESOPHAGOGASTRODUODENOSCOPY (EGD);  Surgeon: Lollie Sails, MD;  Location: [facility] ENDOSCOPY;  Service: Endoscopy;  Laterality: N/A;   ESOPHAGOGASTRODUODENOSCOPY (EGD) WITH PROPOFOL N/A 04/04/2022   Procedure: ESOPHAGOGASTRODUODENOSCOPY (EGD) WITH PROPOFOL;  Surgeon: Lesly Rubenstein, MD;  Location: [facility] ENDOSCOPY;  Service: Endoscopy;  Laterality: N/A;   GASTRIC BYPASS     INCISE AND DRAIN ABCESS Right    finger   MYOMECTOMY     TONSILLECTOMY      OB History   No obstetric history on file.      Home Medications    Prior to Admission medications   Medication Sig Start Date End Date Taking? Authorizing Provider  acetaminophen (TYLENOL) 500 MG tablet Take 1,000 mg by mouth every 6 (six) hours as needed.    [provider]  albuterol (VENTOLIN HFA) 108 (90 Base) MCG/ACT inhaler Inhale 1-2 puffs into the lungs every 6 (six) hours as needed for wheezing or shortness of breath. Patient taking differently: Inhale 1-2 puffs into the lungs every 6 (six) hours as needed for wheezing or shortness of breath. Not used recently. 09/23/19   Norval Gable, MD  baclofen (LIORESAL) 10 MG tablet Take 1 tablet (10 mg total) by mouth 3 (three) times daily as needed for muscle spasms. Patient not taking: Reported on 09/09/2022 12/19/21   Laurene Footman  B, PA-C  cholecalciferol (VITAMIN D) 1000 units tablet Take 4,000 Units daily by mouth.     [provider]  DULoxetine (CYMBALTA) 30 MG capsule Take 30 mg by mouth daily.    [provider]  fluticasone (FLONASE) 50 MCG/ACT nasal spray Place 2 sprays into both nostrils daily. 12/05/15   Frederich Cha, MD  gabapentin (NEURONTIN) 600 MG tablet Take 600 mg by mouth 3 (three) times daily. 04/24/21   [provider]  Glatiramer Acetate 40 MG/ML SOSY Inject into the skin. Patient not taking: Reported on 02/25/2022    [provider]  interferon beta-1a (AVONEX) 30 MCG injection Inject into the  muscle. 05/31/19 05/30/20  [provider]  ipratropium (ATROVENT) 0.06 % nasal spray Place 2 sprays into both nostrils 4 (four) times daily. Patient taking differently: Place 2 sprays into both nostrils 2 (two) times daily. 05/08/21   Laurene Footman B, PA-C  Loratadine 10 MG CAPS Take by mouth.    [provider]  magnesium 30 MG tablet Take 30 mg by mouth 2 (two) times daily.    [provider]  Multiple Vitamins-Minerals (HAIR SKIN & NAILS ADVANCED PO) Take 3 tablets by mouth daily.    [provider]  Ocrelizumab (OCREVUS IV) Inject into the vein. Twice a year    [provider]  oxyCODONE-acetaminophen (PERCOCET) 5-325 MG tablet Take 1-2 tablets by mouth every 6 (six) hours as needed for severe pain. Max 6 tabs per day 09/17/22   [name], DPM  pantoprazole (PROTONIX) 40 MG tablet Take 40 mg by mouth daily.    [provider]  terbinafine (LAMISIL) 250 MG tablet Take 250 mg by mouth daily. Patient not taking: Reported on 09/09/2022    [provider]  trimethoprim-polymyxin b (POLYTRIM) ophthalmic solution Place 1 drop into both eyes every 4 (four) hours. Patient not taking: Reported on 02/25/2022 07/22/21   Hans Eden, NP  vitamin B-12 (CYANOCOBALAMIN) 1000 MCG tablet Take 1,000 mcg by mouth daily.    [provider]    Family History Family History  Problem Relation Age of Onset   Breast cancer Sister 34   Breast cancer Paternal 27    Breast cancer Cousin        pat cousin    Social History Social History   Tobacco Use   Smoking status: Former    Packs/day: 2.00    Years: 13.00    Total pack years: 26.00    Types: Cigarettes    Quit date: 1991    Years since quitting: 33.1   Smokeless tobacco: Never  Vaping Use   Vaping Use: Never used  Substance Use Topics   Alcohol use: No   Drug use: No     Allergies   Latex   Review of Systems Review of Systems  Constitutional:  Negative for  chills, fatigue and fever.  Gastrointestinal:  Negative for abdominal pain, diarrhea, nausea and vomiting.  Genitourinary:  Positive for decreased urine volume and difficulty urinating. Negative for dysuria, flank pain, frequency, hematuria, pelvic pain, urgency, vaginal bleeding, vaginal discharge and vaginal pain.  Musculoskeletal:  Negative for back pain.  Skin:  Negative for rash.     Physical Exam Triage Vital Signs ED Triage Vitals  Enc Vitals Group     BP 09/20/22 1206 122/67     Pulse Rate 09/20/22 1206 69     Resp 09/20/22 1206 14     Temp 09/20/22 1206 98.4 F (36.9 C)  Temp Source 09/20/22 1206 Oral     SpO2 09/20/22 1206 97 %     Weight 09/20/22 1204 210 lb 1.6 oz (95.3 kg)     Height 09/20/22 1204 5' 6"$  (1.676 m)     Head Circumference --      Peak Flow --      Pain Score 09/20/22 1204 0     Pain Loc --      Pain Edu? --      Excl. in [place]? --    No data found.  Updated Vital Signs BP 122/67 (BP Location: Left Arm)   Pulse 69   Temp 98.4 F (36.9 C) (Oral)   Resp 14   Ht 5' 6"$  (1.676 m)   Wt 210 lb 1.6 oz (95.3 kg)   SpO2 97%   BMI 33.91 kg/m     Physical Exam Vitals and nursing note reviewed.  Constitutional:      General: She is not in acute distress.    Appearance: Normal appearance. She is not ill-appearing or toxic-appearing.  HENT:     Head: Normocephalic and atraumatic.  Eyes:     General: No scleral icterus.       Right eye: No discharge.        Left eye: No discharge.     Conjunctiva/sclera: Conjunctivae normal.  Cardiovascular:     Rate and Rhythm: Normal rate and regular rhythm.     Heart sounds: Normal heart sounds.  Pulmonary:     Effort: Pulmonary effort is normal. No respiratory distress.     Breath sounds: Normal breath sounds.  Abdominal:     Palpations: Abdomen is soft.     Tenderness: There is no abdominal tenderness.  Musculoskeletal:     Cervical back: Neck supple.  Skin:    General: Skin is dry.  Neurological:      General: No focal deficit present.     Mental Status: She is alert. Mental status is at baseline.     Motor: No weakness.  Psychiatric:        Mood and Affect: Mood normal.      UC Treatments / Results  Labs (all labs ordered are listed, but only abnormal results are displayed) Labs Reviewed - No data to display  EKG   Radiology No results found.  Procedures Procedures (including critical care time)  Medications Ordered in UC Medications - No data to display  Initial Impression / Assessment and Plan / UC Course  I have reviewed the triage vital signs and the nursing notes.  Pertinent labs & imaging results that were available during my care of the patient were reviewed by me and considered in my medical decision making (see chart for details).   62 year old female with history of MS presents for urinary retention for the past 2 days.  Reports having a surgery on her right foot the day before onset of urinary retention.  She denies ever having any pain with urination.  She has not had any pain of her abdomen but reports fullness and pressure.  Vitals are stable and she is overall well-appearing.  Abdomen is soft and nontender.  Advised patient further and immediate workup in the emergency department for urinary retention.  Unable to catheterize patient or work her up for this condition in the urgent care.  She is understanding and agrees to go to [facility] at this time.  We discussed that if she is not seen for this then her condition will  worsen and can cause damage to her kidneys, infection, etc.   Final Clinical Impressions(s) / UC Diagnoses   Final diagnoses:  Urinary retention  Recent surgical procedure on lower extremity     Discharge Instructions      You have been advised to follow up immediately in the emergency department for concerning signs.symptoms. If you declined EMS transport, please have a family member take you directly to the ED at this  time. Do not delay. Based on concerns about condition, if you do not follow up in th e ED, you may risk poor outcomes including worsening of condition, delayed treatment and potentially life threatening issues. If you have declined to go to the ED at this time, you should call your PCP immediately to set up a follow up appointment.  Go to ED for red flag symptoms, including; fevers you cannot reduce with Tylenol/Motrin, severe headaches, vision changes, numbness/weakness in part of the body, lethargy, confusion, intractable vomiting, severe dehydration, chest pain, breathing difficulty, severe persistent abdominal or pelvic pain, signs of severe infection (increased redness, swelling of an area), feeling faint or passing out, dizziness, etc. You should especially go to the ED for sudden acute worsening of condition if you do not elect to go at this time.     ED Prescriptions   None    PDMP not reviewed this encounter.   Danton Clap, PA-C 09/20/22 1243

## 2022-09-20 NOTE — ED Notes (Signed)
Patient is being discharged from the Urgent Care and sent to the Emergency Department via POV . Per Christene Slates, PA, patient is in need of higher level of care due to Urinary retention. Patient is aware and verbalizes understanding of plan of care.  Vitals:   09/20/22 1206  BP: 122/67  Pulse: 69  Resp: 14  Temp: 98.4 F (36.9 C)  SpO2: 97%

## 2024-02-24 ENCOUNTER — Other Ambulatory Visit: Payer: Self-pay | Admitting: Physician Assistant

## 2024-02-24 DIAGNOSIS — Z1231 Encounter for screening mammogram for malignant neoplasm of breast: Secondary | ICD-10-CM

## 2024-03-29 ENCOUNTER — Ambulatory Visit
Admission: RE | Admit: 2024-03-29 | Discharge: 2024-03-29 | Disposition: A | Discharge status: Home / Self Care | Source: Ambulatory Visit | Attending: Physician Assistant | Admitting: Physician Assistant

## 2024-03-29 ENCOUNTER — Ambulatory Visit

## 2024-03-29 DIAGNOSIS — Z1231 Encounter for screening mammogram for malignant neoplasm of breast: Secondary | ICD-10-CM | POA: Diagnosis present

## 2024-04-14 ENCOUNTER — Encounter: Payer: Self-pay | Admitting: *Deleted

## 2024-05-20 ENCOUNTER — Encounter
Admission: RE | Disposition: A | Discharge status: Home / Self Care | Payer: Self-pay | Source: Home / Self Care | Attending: Gastroenterology

## 2024-05-20 ENCOUNTER — Ambulatory Visit: Admitting: Certified Registered"

## 2024-05-20 ENCOUNTER — Ambulatory Visit
Admission: RE | Admit: 2024-05-20 | Discharge: 2024-05-20 | Disposition: A | Discharge status: Home / Self Care | Attending: Gastroenterology | Admitting: Gastroenterology

## 2024-05-20 DIAGNOSIS — G35D Multiple sclerosis, unspecified: Secondary | ICD-10-CM | POA: Insufficient documentation

## 2024-05-20 DIAGNOSIS — Z809 Family history of malignant neoplasm, unspecified: Secondary | ICD-10-CM | POA: Insufficient documentation

## 2024-05-20 DIAGNOSIS — Z9884 Bariatric surgery status: Secondary | ICD-10-CM | POA: Insufficient documentation

## 2024-05-20 DIAGNOSIS — F419 Anxiety disorder, unspecified: Secondary | ICD-10-CM | POA: Diagnosis not present

## 2024-05-20 DIAGNOSIS — F32A Depression, unspecified: Secondary | ICD-10-CM | POA: Insufficient documentation

## 2024-05-20 DIAGNOSIS — E669 Obesity, unspecified: Secondary | ICD-10-CM | POA: Diagnosis not present

## 2024-05-20 DIAGNOSIS — Z6835 Body mass index (BMI) 35.0-35.9, adult: Secondary | ICD-10-CM | POA: Diagnosis not present

## 2024-05-20 DIAGNOSIS — R1013 Epigastric pain: Secondary | ICD-10-CM | POA: Insufficient documentation

## 2024-05-20 DIAGNOSIS — Z800 Family history of malignant neoplasm of digestive organs: Secondary | ICD-10-CM | POA: Insufficient documentation

## 2024-05-20 DIAGNOSIS — Z1211 Encounter for screening for malignant neoplasm of colon: Secondary | ICD-10-CM | POA: Diagnosis not present

## 2024-05-20 DIAGNOSIS — J45909 Unspecified asthma, uncomplicated: Secondary | ICD-10-CM | POA: Diagnosis not present

## 2024-05-20 DIAGNOSIS — Z860101 Personal history of adenomatous and serrated colon polyps: Secondary | ICD-10-CM | POA: Diagnosis not present

## 2024-05-20 DIAGNOSIS — K5730 Diverticulosis of large intestine without perforation or abscess without bleeding: Secondary | ICD-10-CM | POA: Diagnosis not present

## 2024-05-20 DIAGNOSIS — Z87891 Personal history of nicotine dependence: Secondary | ICD-10-CM | POA: Insufficient documentation

## 2024-05-20 DIAGNOSIS — K640 First degree hemorrhoids: Secondary | ICD-10-CM | POA: Insufficient documentation

## 2024-05-20 HISTORY — DX: Vitamin D deficiency, unspecified: E55.9

## 2024-05-20 HISTORY — PX: ESOPHAGOGASTRODUODENOSCOPY: SHX5428

## 2024-05-20 HISTORY — DX: Prediabetes: R73.03

## 2024-05-20 HISTORY — PX: COLONOSCOPY: SHX5424

## 2024-05-20 HISTORY — DX: Bariatric surgery status: Z98.84

## 2024-05-20 SURGERY — COLONOSCOPY
Anesthesia: General

## 2024-05-20 MED ORDER — LIDOCAINE HCL (CARDIAC) PF 100 MG/5ML IV SOSY
PREFILLED_SYRINGE | INTRAVENOUS | Status: DC | PRN
  Administered 2024-05-20: 100 mg via INTRAVENOUS

## 2024-05-20 MED ORDER — GLYCOPYRROLATE 0.2 MG/ML IJ SOLN
INTRAMUSCULAR | Status: DC | PRN
  Administered 2024-05-20: .2 mg via INTRAVENOUS

## 2024-05-20 MED ORDER — MIDAZOLAM HCL (PF) 2 MG/2ML IJ SOLN
INTRAMUSCULAR | Status: DC | PRN
  Administered 2024-05-20: 2 mg via INTRAVENOUS

## 2024-05-20 MED ORDER — MIDAZOLAM HCL 2 MG/2ML IJ SOLN
INTRAMUSCULAR | Status: AC
  Filled 2024-05-20: qty 2

## 2024-05-20 MED ORDER — ACETAMINOPHEN 325 MG PO TABS: 650.0000 mg | ORAL_TABLET | Freq: Four times a day (QID) | ORAL | Status: DC | PRN

## 2024-05-20 MED ORDER — PROPOFOL 500 MG/50ML IV EMUL
INTRAVENOUS | Status: DC | PRN
  Administered 2024-05-20: 155 ug/kg/min via INTRAVENOUS

## 2024-05-20 MED ORDER — SODIUM CHLORIDE 0.9 % IV SOLN: INTRAVENOUS | Status: DC

## 2024-05-20 MED ORDER — PROPOFOL 10 MG/ML IV BOLUS
INTRAVENOUS | Status: DC | PRN
  Administered 2024-05-20: 20 mg via INTRAVENOUS
  Administered 2024-05-20: 60 mg via INTRAVENOUS

## 2024-05-20 NOTE — Op Note (Signed)
 [facility] Patient Name: Nancy Duran Procedure Date: 05/20/2024 12:25 PM MRN: [id number] Account #: [id number] Date of Birth: [birth year] Admit Type: Outpatient Age: 63 Room: [facility] ENDO ROOM 3 Gender: Female Note Status: Finalized Instrument Name: Barnie GI Scope [id number] Procedure:             Upper GI endoscopy Indications:           Dyspepsia Providers:             Ole Schick MD, MD Referring MD:          Olivia Monte (Referring MD) Medicines:             Monitored Anesthesia Care Complications:         No immediate complications. Estimated blood loss:                         Minimal. Procedure:             Pre-Anesthesia Assessment:                        - Prior to the procedure, a History and Physical was                         performed, and patient medications and allergies were                         reviewed. The patient is competent. The risks and                         benefits of the procedure and the sedation options and                         risks were discussed with the patient. All questions                         were answered and informed consent was obtained.                         Patient identification and proposed procedure were                         verified by the physician, the nurse, the                         anesthesiologist, the anesthetist and the technician                         in the endoscopy suite. Mental Status Examination:                         alert and oriented. Airway Examination: normal                         oropharyngeal airway and neck mobility. Respiratory                         Examination: clear to auscultation. CV Examination:  normal. Prophylactic Antibiotics: The patient does not                         require prophylactic antibiotics. Prior                         Anticoagulants: The patient has taken no anticoagulant                          or antiplatelet agents. ASA Grade Assessment: II - A                         patient with mild systemic disease. After reviewing                         the risks and benefits, the patient was deemed in                         satisfactory condition to undergo the procedure. The                         anesthesia plan was to use monitored anesthesia care                         (MAC). Immediately prior to administration of                         medications, the patient was re-assessed for adequacy                         to receive sedatives. The heart rate, respiratory                         rate, oxygen saturations, blood pressure, adequacy of                         pulmonary ventilation, and response to care were                         monitored throughout the procedure. The physical                         status of the patient was re-assessed after the                         procedure.                        After obtaining informed consent, the endoscope was                         passed under direct vision. Throughout the procedure,                         the patient's blood pressure, pulse, and oxygen                         saturations were monitored continuously. The Endoscope  was introduced through the mouth, and advanced to the                         jejunal crest. The upper GI endoscopy was accomplished                         without difficulty. The patient tolerated the                         procedure well. Findings:      The examined esophagus was normal.      Evidence of a Roux-en-Y gastrojejunostomy was found. The gastrojejunal       anastomosis was characterized by healthy appearing mucosa. This was       traversed. The pouch-to-jejunum limb was characterized by healthy       appearing mucosa. The jejunojejunal anastomosis was characterized by       healthy appearing mucosa.      Biopsies were taken with a cold forceps in the stomach  for Helicobacter       pylori testing. Estimated blood loss was minimal.      The examined jejunum was normal. Impression:            - Normal esophagus.                        - Roux-en-Y gastrojejunostomy with gastrojejunal                         anastomosis characterized by healthy appearing mucosa.                        - Normal examined jejunum.                        - Biopsies were taken with a cold forceps for                         Helicobacter pylori testing. Recommendation:        - Discharge patient to home.                        - Resume previous diet.                        - Continue present medications.                        - Await pathology results.                        - Return to referring physician as previously                         scheduled. Procedure Code(s):     --- Professional ---                        [phone], Esophagogastroduodenoscopy, flexible,                         transoral; with biopsy, single or multiple Diagnosis Code(s):     --- Professional ---  Z98.0, Intestinal bypass and anastomosis status                        R10.13, Epigastric pain CPT copyright 2022 American [employer] Association. All rights reserved. The codes documented in this report are preliminary and upon coder review may  be revised to meet current compliance requirements. Ole Schick MD, MD 05/20/2024 1:09:59 PM Number of Addenda: 0 Note Initiated On: 05/20/2024 12:25 PM Estimated Blood Loss:  Estimated blood loss was minimal.      [facility]

## 2024-05-20 NOTE — H&P (Signed)
 Outpatient short stay form Pre-procedure 05/20/2024  Ole [name], MD  Primary Physician: Perri Constance Sor, PA-C  Reason for visit:  Dyspepsia/Personal history of polyps  History of present illness:    63 y/o lady with history of gastric bypass, obesity, and MS here for EGD for dyspepsia and colonoscopy for history of polyps. Last colonoscopy in 2019 with small polyps. No blood thinners. Family history of different cancers, unknown if colon except one cousin with colon cancer.    Current Facility-Administered Medications:    0.9 %  sodium chloride  infusion, , Intravenous, Continuous, Adaisha Campise, Ole ONEIDA, MD, Last Rate: 20 mL/hr at 05/20/24 1142, New Bag at 05/20/24 1142   acetaminophen  (TYLENOL ) tablet 650 mg, 650 mg, Oral, Q6H PRN, Leavy Ned, MD  Medications Prior to Admission  Medication Sig Dispense Refill Last Dose/Taking   cholecalciferol (VITAMIN D) 1000 units tablet Take 4,000 Units daily by mouth.    Past Week   DULoxetine (CYMBALTA) 30 MG capsule Take 30 mg by mouth daily.   Past Week   fluticasone  (FLONASE ) 50 MCG/ACT nasal spray Place 2 sprays into both nostrils daily. 16 g 0 Past Week   gabapentin (NEURONTIN) 600 MG tablet Take 600 mg by mouth 3 (three) times daily.   Past Week   hyoscyamine (LEVSIN SL) 0.125 MG SL tablet Place 0.125 mg under the tongue every 6 (six) hours as needed for cramping.   Past Week   Loratadine 10 MG CAPS Take by mouth.   Past Week   Magnesium 200 MG TABS Take 30 mg by mouth 2 (two) times daily.   Past Week   minoxidil (LONITEN) 2.5 MG tablet Take by mouth daily.   Past Week   Multiple Vitamins-Minerals (HAIR SKIN & NAILS ADVANCED PO) Take 3 tablets by mouth daily.   Past Week   Ocrelizumab (OCREVUS IV) Inject into the vein. Twice a year   Past Month   oxyCODONE -acetaminophen  (PERCOCET) 5-325 MG tablet Take 1-2 tablets by mouth every 6 (six) hours as needed for severe pain. Max 6 tabs per day 30 tablet 0 Past Week   pantoprazole  (PROTONIX) 40 MG tablet Take 40 mg by mouth daily.   Past Week   vitamin B-12 (CYANOCOBALAMIN) 1000 MCG tablet Take 1,000 mcg by mouth daily.   Past Week   Zinc Sulfate (ZINC 15 PO) Take by mouth.   Past Week   acetaminophen  (TYLENOL ) 500 MG tablet Take 1,000 mg by mouth every 6 (six) hours as needed.      albuterol  (VENTOLIN  HFA) 108 (90 Base) MCG/ACT inhaler Inhale 1-2 puffs into the lungs every 6 (six) hours as needed for wheezing or shortness of breath. (Patient taking differently: Inhale 1-2 puffs into the lungs every 6 (six) hours as needed for wheezing or shortness of breath. Not used recently.) 8 g 0    baclofen  (LIORESAL ) 10 MG tablet Take 1 tablet (10 mg total) by mouth 3 (three) times daily as needed for muscle spasms. (Patient not taking: Reported on 09/09/2022) 30 each 0    Glatiramer Acetate 40 MG/ML SOSY Inject into the skin. (Patient not taking: Reported on 02/25/2022)      interferon beta-1a (AVONEX) 30 MCG injection Inject into the muscle.      ipratropium (ATROVENT ) 0.06 % nasal spray Place 2 sprays into both nostrils 4 (four) times daily. (Patient taking differently: Place 2 sprays into both nostrils 2 (two) times daily.) 15 mL 0    terbinafine (LAMISIL) 250 MG tablet Take 250 mg  by mouth daily. (Patient not taking: Reported on 09/09/2022)      trimethoprim -polymyxin b  (POLYTRIM ) ophthalmic solution Place 1 drop into both eyes every 4 (four) hours. (Patient not taking: Reported on 02/25/2022) 10 mL 0      Allergies  Allergen Reactions   Latex Itching and Rash     Past Medical History:  Diagnosis Date   Anemia    Hx of   Anxiety    Asthma    shortness of breath occasional   Bariatric surgery status    COVID 09/01/2022   DDD (degenerative disc disease), lumbosacral 02/05/2018   lesions on spine   Depression    Foot pain, right    arthritis   Hyperlipidemia    MS (multiple sclerosis)    symptoms since 1990, diagnosed 2008/ neuropathy, numbness knees down, small amt in  fingers   Neuromuscular disorder (HCC)    MS   PONV (postoperative nausea and vomiting)    Pre-diabetes    Vertigo    HX of   Vitamin D deficiency     Review of systems:  Otherwise negative.    Physical Exam  Gen: Alert, oriented. Appears stated age.  HEENT: PERRLA. Lungs: No respiratory distress CV: RRR Abd: soft, benign, no masses Ext: No edema    Planned procedures: Proceed with EGD/colonoscopy. The patient understands the nature of the planned procedure, indications, risks, alternatives and potential complications including but not limited to bleeding, infection, perforation, damage to internal organs and possible oversedation/side effects from anesthesia. The patient agrees and gives consent to proceed.  Please refer to procedure notes for findings, recommendations and patient disposition/instructions.     Ole [name], MD [facility]

## 2024-05-20 NOTE — Anesthesia Preprocedure Evaluation (Signed)
 Anesthesia Evaluation  Patient identified by MRN, date of birth, ID band Patient awake    Reviewed: Allergy & Precautions, H&P , NPO status , Patient's Chart, lab work & pertinent test results, reviewed documented beta blocker date and time   History of Anesthesia Complications (+) PONV and history of anesthetic complications  Airway Mallampati: II  TM Distance: >3 FB Neck ROM: full    Dental  (+) Chipped, Dental Advidsory Given, Missing   Pulmonary neg shortness of breath, asthma , neg sleep apnea, neg COPD, neg recent URI, former smoker   Pulmonary exam normal breath sounds clear to auscultation       Cardiovascular Exercise Tolerance: Good Normal cardiovascular exam Rhythm:regular Rate:Normal     Neuro/Psych neg Seizures PSYCHIATRIC DISORDERS Anxiety Depression    MS  Neuromuscular disease    GI/Hepatic negative GI ROS, Neg liver ROS,,,  Endo/Other  negative endocrine ROS    Renal/GU      Musculoskeletal   Abdominal   Peds  Hematology negative hematology ROS (+)   Anesthesia Other Findings Past Medical History: No date: Anemia No date: Anxiety No date: Asthma 02/05/2018: DDD (degenerative disc disease), lumbosacral No date: Depression No date: Hyperlipidemia No date: MS (multiple sclerosis) (HCC)     Comment:  symptoms since 1990, diagnosed 2008 No date: Neuromuscular disorder (HCC)     Comment:  MS No date: PONV (postoperative nausea and vomiting)   Reproductive/Obstetrics negative OB ROS                              Anesthesia Physical Anesthesia Plan  ASA: 2  Anesthesia Plan: General   Post-op Pain Management: Minimal or no pain anticipated   Induction: Intravenous  PONV Risk Score and Plan: 2 and Propofol  infusion and TIVA  Airway Management Planned: Nasal Cannula  Additional Equipment: None  Intra-op Plan:   Post-operative Plan:   Informed Consent: I have  reviewed the patients History and Physical, chart, labs and discussed the procedure including the risks, benefits and alternatives for the proposed anesthesia with the patient or authorized representative who has indicated his/her understanding and acceptance.     Dental advisory given  Plan Discussed with: CRNA and Surgeon  Anesthesia Plan Comments: (Discussed risks of anesthesia with patient, including possibility of difficulty with spontaneous ventilation under anesthesia necessitating airway intervention, PONV, and rare risks such as cardiac or respiratory or neurological events, and allergic reactions. Discussed the role of CRNA in patient's perioperative care. Patient understands.)        Anesthesia Quick Evaluation

## 2024-05-20 NOTE — Op Note (Signed)
 [facility] Patient Name: Nancy Duran Procedure Date: 05/20/2024 12:25 PM MRN: [id number] Account #: [id number] Date of Birth: [birth year] Admit Type: Outpatient Age: 63 Room: [facility] ENDO ROOM 3 Gender: Female Note Status: Finalized Instrument Name: Colon Scope [id number] Procedure:             Colonoscopy Indications:           Surveillance: Personal history of adenomatous polyps                         on last colonoscopy > 5 years ago Providers:             Ole Schick MD, MD Referring MD:          Olivia Monte (Referring MD) Medicines:             Monitored Anesthesia Care Complications:         No immediate complications. Procedure:             Pre-Anesthesia Assessment:                        - Prior to the procedure, a History and Physical was                         performed, and patient medications and allergies were                         reviewed. The patient is competent. The risks and                         benefits of the procedure and the sedation options and                         risks were discussed with the patient. All questions                         were answered and informed consent was obtained.                         Patient identification and proposed procedure were                         verified by the physician, the nurse, the                         anesthesiologist, the anesthetist and the technician                         in the endoscopy suite. Mental Status Examination:                         alert and oriented. Airway Examination: normal                         oropharyngeal airway and neck mobility. Respiratory                         Examination: clear to auscultation. CV Examination:  normal. Prophylactic Antibiotics: The patient does not                         require prophylactic antibiotics. Prior                         Anticoagulants: The patient has taken no  anticoagulant                         or antiplatelet agents. ASA Grade Assessment: II - A                         patient with mild systemic disease. After reviewing                         the risks and benefits, the patient was deemed in                         satisfactory condition to undergo the procedure. The                         anesthesia plan was to use monitored anesthesia care                         (MAC). Immediately prior to administration of                         medications, the patient was re-assessed for adequacy                         to receive sedatives. The heart rate, respiratory                         rate, oxygen saturations, blood pressure, adequacy of                         pulmonary ventilation, and response to care were                         monitored throughout the procedure. The physical                         status of the patient was re-assessed after the                         procedure.                        After obtaining informed consent, the colonoscope was                         passed under direct vision. Throughout the procedure,                         the patient's blood pressure, pulse, and oxygen                         saturations were monitored continuously. The  Colonoscope was introduced through the anus and                         advanced to the the cecum, identified by appendiceal                         orifice and ileocecal valve. The colonoscopy was                         somewhat difficult due to significant looping.                         Successful completion of the procedure was aided by                         applying abdominal pressure. The patient tolerated the                         procedure well. The quality of the bowel preparation                         was good. The ileocecal valve, appendiceal orifice,                         and rectum were photographed. Findings:      The  perianal and digital rectal examinations were normal.      A few small-mouthed diverticula were found in the ascending colon.      Internal hemorrhoids were found during retroflexion. The hemorrhoids       were Grade I (internal hemorrhoids that do not prolapse).      The exam was otherwise without abnormality on direct and retroflexion       views. Impression:            - Diverticulosis in the ascending colon.                        - Internal hemorrhoids.                        - The examination was otherwise normal on direct and                         retroflexion views.                        - No specimens collected. Recommendation:        - Discharge patient to home.                        - Resume previous diet.                        - Continue present medications.                        - Repeat colonoscopy in 10 years for surveillance.                        - Return to referring physician as previously  scheduled. Procedure Code(s):     --- Professional ---                        H9894, Colorectal cancer screening; colonoscopy on                         individual at high risk Diagnosis Code(s):     --- Professional ---                        Z86.010, Personal history of colonic polyps                        K64.0, First degree hemorrhoids                        K57.30, Diverticulosis of large intestine without                         perforation or abscess without bleeding CPT copyright 2022 [employer]. All rights reserved. The codes documented in this report are preliminary and upon coder review may  be revised to meet current compliance requirements. Ole Schick MD, MD 05/20/2024 1:12:54 PM Number of Addenda: 0 Note Initiated On: 05/20/2024 12:25 PM Scope Withdrawal Time: 0 hours 9 minutes 36 seconds  Total Procedure Duration: 0 hours 15 minutes 54 seconds  Estimated Blood Loss:  Estimated blood loss: none.      [facility]

## 2024-05-20 NOTE — Transfer of Care (Signed)
 Immediate Anesthesia Transfer of Care Note  Patient: Nancy Duran  Procedure(s) Performed: COLONOSCOPY EGD (ESOPHAGOGASTRODUODENOSCOPY)  Patient Location: Endoscopy Unit  Anesthesia Type:General  Level of Consciousness: drowsy and patient cooperative  Airway & Oxygen Therapy: Patient Spontanous Breathing and Patient connected to face mask oxygen  Post-op Assessment: Report given to RN and Post -op Vital signs reviewed and stable  Post vital signs: Reviewed and stable  Last Vitals:  Vitals Value Taken Time  BP 110/65 05/20/24 12:58  Temp 35.9 C 05/20/24 12:58  Pulse 91 05/20/24 13:01  Resp 18 05/20/24 13:01  SpO2 100 % 05/20/24 13:01  Vitals shown include unfiled device data.  Last Pain:  Vitals:   05/20/24 1258  TempSrc: Temporal  PainSc: Asleep         Complications: No notable events documented.

## 2024-05-20 NOTE — Anesthesia Postprocedure Evaluation (Signed)
 Anesthesia Post Note  Patient: Nancy Duran  Procedure(s) Performed: COLONOSCOPY EGD (ESOPHAGOGASTRODUODENOSCOPY)  Patient location during evaluation: Endoscopy Anesthesia Type: General Level of consciousness: awake and alert Pain management: pain level controlled Vital Signs Assessment: post-procedure vital signs reviewed and stable Respiratory status: spontaneous breathing, nonlabored ventilation, respiratory function stable and patient connected to nasal cannula oxygen Cardiovascular status: blood pressure returned to baseline and stable Postop Assessment: no apparent nausea or vomiting Anesthetic complications: no   No notable events documented.   Last Vitals:  Vitals:   05/20/24 1054 05/20/24 1258  BP: 131/78 110/65  Pulse: 73 100  Resp: 18 17  Temp: (!) 36.1 C (!) 35.9 C  SpO2: 100% 100%    Last Pain:  Vitals:   05/20/24 1258  TempSrc: Temporal  PainSc: Asleep                 Debby Mines

## 2024-05-20 NOTE — Interval H&P Note (Signed)
 History and Physical Interval Note:  05/20/2024 12:06 PM  Nancy Duran  has presented today for surgery, with the diagnosis of Dyspepsia [R10.13]   Personal history of adenomatous and serrated colon polyps [Z86.0101] Gastroesophageal reflux disease, unspecified whether esophagitis present [K21.9].  The various methods of treatment have been discussed with the patient and family. After consideration of risks, benefits and other options for treatment, the patient has consented to  Procedure(s): COLONOSCOPY (N/A) EGD (ESOPHAGOGASTRODUODENOSCOPY) (N/A) as a surgical intervention.  The patient's history has been reviewed, patient examined, no change in status, stable for surgery.  I have reviewed the patient's chart and labs.  Questions were answered to the patient's satisfaction.     Ole [name]  Ok to proceed with EGD/Colonoscopy

## 2024-05-24 LAB — SURGICAL PATHOLOGY
